# Patient Record
Sex: Female | Born: 1985 | Hispanic: Yes | Marital: Married | Smoking: Never smoker
Health system: Southern US, Community
[De-identification: ages and names within clinical notes are randomized; demographics above are authoritative.]

## PROBLEM LIST (undated history)

## (undated) DIAGNOSIS — F32A Depression, unspecified: Secondary | ICD-10-CM

## (undated) DIAGNOSIS — O24419 Gestational diabetes mellitus in pregnancy, unspecified control: Secondary | ICD-10-CM

## (undated) HISTORY — PX: OTHER SURGICAL HISTORY: SHX169

---

## 2007-05-28 DIAGNOSIS — O321XX0 Maternal care for breech presentation, not applicable or unspecified: Secondary | ICD-10-CM

## 2015-08-30 HISTORY — PX: OTHER SURGICAL HISTORY: SHX169

## 2020-10-23 ENCOUNTER — Other Ambulatory Visit: Payer: Self-pay

## 2020-10-23 ENCOUNTER — Encounter (HOSPITAL_COMMUNITY): Payer: Self-pay | Admitting: Emergency Medicine

## 2020-10-23 ENCOUNTER — Emergency Department (HOSPITAL_COMMUNITY)
Admission: EM | Admit: 2020-10-23 | Discharge: 2020-10-23 | Disposition: A | Discharge status: Home / Self Care | Payer: Self-pay | Attending: Emergency Medicine | Admitting: Emergency Medicine

## 2020-10-23 DIAGNOSIS — N898 Other specified noninflammatory disorders of vagina: Secondary | ICD-10-CM | POA: Insufficient documentation

## 2020-10-23 DIAGNOSIS — Z3A01 Less than 8 weeks gestation of pregnancy: Secondary | ICD-10-CM | POA: Insufficient documentation

## 2020-10-23 DIAGNOSIS — O209 Hemorrhage in early pregnancy, unspecified: Secondary | ICD-10-CM | POA: Insufficient documentation

## 2020-10-23 LAB — WET PREP, GENITAL
Clue Cells Wet Prep HPF POC: NONE SEEN
Sperm: NONE SEEN
Trich, Wet Prep: NONE SEEN
WBC, Wet Prep HPF POC: NONE SEEN
Yeast Wet Prep HPF POC: NONE SEEN

## 2020-10-23 LAB — HCG, QUANTITATIVE, PREGNANCY: hCG, Beta Chain, Quant, S: 11 m[IU]/mL — ABNORMAL HIGH (ref <5)

## 2020-10-23 LAB — GC/CHLAMYDIA PROBE AMP (~~LOC~~) NOT AT ARMC
Chlamydia: NEGATIVE
Comment: NEGATIVE
Comment: NORMAL
Neisseria Gonorrhea: NEGATIVE

## 2020-10-23 NOTE — ED Triage Notes (Signed)
Patient states she took two home test on Monday and they was positive. Patient last menstraul was January 22,2022. Patient states that she is having light pink tinge blood. Patient states she just started bleeding.

## 2020-10-23 NOTE — Discharge Instructions (Signed)
Follow up with Obgyn

## 2020-10-23 NOTE — ED Provider Notes (Signed)
[facility]-EMERGENCY DEPT Provider Note   CSN: [id number] Arrival date & time: 10/23/20  0126     History Chief Complaint  Patient presents with  . Possible Pregnancy    Colleen Patterson is a 35 y.o. female G2 P2 presents for evaluation of possible pregnancy.  LMP 10/20/2020.  States she took pregnancy test on Monday which was positive.  Patient states she would urinate approximately 20 minutes PTA and noted she had light pink tinge of blood when she wiped.  She denies any clots.  Patient does states she has had a white vaginal discharge.  She denies any concerns for STDs.  She has no associated pain.  No lightheadedness or dizziness.  She is unsure blood type however does not think she got RhoGam with her prior pregnancies.  No fever, chills, nausea, vomiting abdominal pain, pelvic pain, dysuria.  Denies additional aggravating or alleviating factors.  History pain from patient and past medical records. No interpreter used.  HPI     History reviewed. No pertinent past medical history.  There are no problems to display for this patient.   History reviewed. No pertinent surgical history.   OB History    Gravida  1   Para      Term      Preterm      AB      Living        SAB      IAB      Ectopic      Multiple      Live Births              History reviewed. No pertinent family history.  Social History   Tobacco Use  . Smoking status: Never Smoker  . Smokeless tobacco: Never Used  Vaping Use  . Vaping Use: Never used  Substance Use Topics  . Alcohol use: Never  . Drug use: Never    Home Medications Prior to Admission medications   Not on File    Allergies    Patient has no known allergies.  Review of Systems   Review of Systems  Constitutional: Negative.   HENT: Negative.   Respiratory: Negative.   Cardiovascular: Negative.   Gastrointestinal: Negative.   Genitourinary: Positive for menstrual problem and vaginal  bleeding.  Musculoskeletal: Negative.   Skin: Negative.   Neurological: Negative.   All other systems reviewed and are negative.   Physical Exam Updated Vital Signs BP 120/70 (BP Location: Left Arm)   Pulse 69   Temp 98.2 F (36.8 C) (Oral)   Resp 16   Ht 5\' 5"  (1.651 m)   Wt 77.1 kg   LMP 09/19/2020   SpO2 100%   BMI 28.29 kg/m   Physical Exam Vitals and nursing note reviewed. Exam conducted with a chaperone present.  Constitutional:      General: She is not in acute distress.    Appearance: She is well-developed and well-nourished. She is not ill-appearing or toxic-appearing.  HENT:     Head: Normocephalic and atraumatic.     Nose: Nose normal.     Mouth/Throat:     Mouth: Mucous membranes are moist.  Eyes:     Pupils: Pupils are equal, round, and reactive to light.  Cardiovascular:     Rate and Rhythm: Normal rate.     Pulses: Normal pulses and intact distal pulses.     Heart sounds: Normal heart sounds.  Pulmonary:     Effort: Pulmonary effort is  normal. No respiratory distress.     Breath sounds: Normal breath sounds.  Abdominal:     General: Bowel sounds are normal. There is no distension.     Tenderness: There is no abdominal tenderness. There is no right CVA tenderness, left CVA tenderness or guarding.  Genitourinary:    Comments: No adnexal tenderness or masses.  Mild dark red blood at cervical os.  Exam with female tech in room Musculoskeletal:        General: Normal range of motion.     Cervical back: Normal range of motion.  Skin:    General: Skin is warm and dry.     Capillary Refill: Capillary refill takes less than 2 seconds.  Neurological:     General: No focal deficit present.     Mental Status: She is alert.  Psychiatric:        Mood and Affect: Mood and affect normal.    ED Results / Procedures / Treatments   Labs (all labs ordered are listed, but only abnormal results are displayed) Labs Reviewed  HCG, QUANTITATIVE, PREGNANCY -  Abnormal; Notable for the following components:      Result Value   hCG, Beta Chain, Quant, S 11 (*)    All other components within normal limits  WET PREP, GENITAL  GC/CHLAMYDIA PROBE AMP ([place]) NOT AT [facility]    EKG None  Radiology No results found.  Procedures Procedures   Medications Ordered in ED Medications - No data to display  ED Course  I have reviewed the triage vital signs and the nursing notes.  Pertinent labs & imaging results that were available during my care of the patient were reviewed by me and considered in my medical decision making (see chart for details).  35 year old presents for evaluation of positive pregnancy test at home with some vaginal bleeding which began just PTA.  Has not had to wear a pad.  She is approximately 3 days late from her menstrual cycle.  Sexually active with her husband.  No associated abdominal pain.  LMP 09/19/2020.  Abdomen soft, nontender.  Quant hCG 11.  Pelvic exam does show some mild start bleeding at os.  No tenderness.  Given positive pregnancy test, bleeding, recommended assess labs as no Rh studies in computer.  Patient would like to hold off on this.  Discussed risk.  Voiced understanding.  Do not feel she needs ultrasound at this time.  Discussed close outpatient follow-up.  She is agreeable for this.  Given resources as she just moved here from [place].  The patient has been appropriately medically screened and/or stabilized in the ED. I have low suspicion for any other emergent medical condition which would require further screening, evaluation or treatment in the ED or require inpatient management.  Patient is hemodynamically stable and in no acute distress.  Patient able to ambulate in department prior to ED.  Evaluation does not show acute pathology that would require ongoing or additional emergent interventions while in the emergency department or further inpatient treatment.  I have discussed the diagnosis with the  patient and answered all questions.  Pain is been managed while in the emergency department and patient has no further complaints prior to discharge.  Patient is comfortable with plan discussed in room and is stable for discharge at this time.  I have discussed strict return precautions for returning to the emergency department.  Patient was encouraged to follow-up with PCP/specialist refer to at discharge.  MDM Rules/Calculators/A&P                           Final Clinical Impression(s) / ED Diagnoses Final diagnoses:  Bleeding in early pregnancy    Rx / DC Orders ED Discharge Orders    None       Cebert Dettmann A, PA-C 10/23/20 0506    Gilda Crease, MD 10/23/20 [phone]

## 2020-10-23 NOTE — ED Notes (Signed)
Pelvic setup at bedside.

## 2020-10-26 ENCOUNTER — Encounter: Payer: Self-pay | Admitting: Obstetrics and Gynecology

## 2020-10-26 ENCOUNTER — Ambulatory Visit (INDEPENDENT_AMBULATORY_CARE_PROVIDER_SITE_OTHER): Payer: Self-pay | Admitting: *Deleted

## 2020-10-26 ENCOUNTER — Other Ambulatory Visit: Payer: Self-pay

## 2020-10-26 ENCOUNTER — Other Ambulatory Visit: Payer: Self-pay | Admitting: Lactation Services

## 2020-10-26 VITALS — BP 109/79 | HR 69 | Ht 65.0 in | Wt 174.0 lb

## 2020-10-26 DIAGNOSIS — O209 Hemorrhage in early pregnancy, unspecified: Secondary | ICD-10-CM

## 2020-10-26 LAB — ABO AND RH: Rh Factor: POSITIVE

## 2020-10-26 LAB — BETA HCG QUANT (REF LAB): hCG Quant: 1 m[IU]/mL

## 2020-10-26 NOTE — Progress Notes (Signed)
Received call from RN call center stating [facility] was calling to report stat lab of ABO/Rh of A positive.   Baldemar Lenis, MD, [facility] Attending Center for [facility])

## 2020-10-26 NOTE — Progress Notes (Signed)
Pt seen @ [facility] on 2/25 due to light vaginal bleeding. Since then she has had some heavier bleeding and she had one clot observed. She reports no pain. BHCG drawn and pt was informed that she will be called with results later today. Pt does not know her blood type and therefore also had ABO/Rh test done. She voiced understanding and stated that a detailed message can be left on her voicemail if she does not answer.   1435  Reviewed BHCG results (1) with Dr. Debroah Loop who finds decrease in level which is consistent with definitive miscarriage. Blood type is A+, therefore RhoGam is not indicated.   Pt was informed of results and that no further testing is needed. Pt was offered follow up appt in office for birth control or other concerns. She declined due to finances and lack of insurance. Pt stated that she and husband have been trying for almost 2 years to get pregnant. I advised recommendation to resume efforts for pregnancy until after her next normal period. She may use condoms until that time. Pt may schedule appt in the future if desired for Gyn care. She voiced understanding.

## 2021-05-02 ENCOUNTER — Encounter (HOSPITAL_COMMUNITY): Payer: Self-pay | Admitting: Emergency Medicine

## 2021-05-02 ENCOUNTER — Emergency Department (HOSPITAL_COMMUNITY)
Admission: EM | Admit: 2021-05-02 | Discharge: 2021-05-02 | Disposition: A | Discharge status: Home / Self Care | Payer: Medicaid Other | Attending: Emergency Medicine | Admitting: Emergency Medicine

## 2021-05-02 ENCOUNTER — Other Ambulatory Visit: Payer: Self-pay

## 2021-05-02 ENCOUNTER — Emergency Department (HOSPITAL_COMMUNITY): Payer: Medicaid Other

## 2021-05-02 DIAGNOSIS — N938 Other specified abnormal uterine and vaginal bleeding: Secondary | ICD-10-CM | POA: Diagnosis not present

## 2021-05-02 DIAGNOSIS — Z3A01 Less than 8 weeks gestation of pregnancy: Secondary | ICD-10-CM | POA: Insufficient documentation

## 2021-05-02 DIAGNOSIS — O4690 Antepartum hemorrhage, unspecified, unspecified trimester: Secondary | ICD-10-CM

## 2021-05-02 DIAGNOSIS — O26891 Other specified pregnancy related conditions, first trimester: Secondary | ICD-10-CM | POA: Insufficient documentation

## 2021-05-02 DIAGNOSIS — D72829 Elevated white blood cell count, unspecified: Secondary | ICD-10-CM | POA: Diagnosis not present

## 2021-05-02 DIAGNOSIS — R102 Pelvic and perineal pain: Secondary | ICD-10-CM | POA: Diagnosis not present

## 2021-05-02 LAB — COMPREHENSIVE METABOLIC PANEL
ALT: 31 U/L (ref 0–44)
AST: 14 U/L — ABNORMAL LOW (ref 15–41)
Albumin: 4.5 g/dL (ref 3.5–5.0)
Alkaline Phosphatase: 65 U/L (ref 38–126)
Anion gap: 9 (ref 5–15)
BUN: 8 mg/dL (ref 6–20)
CO2: 26 mmol/L (ref 22–32)
Calcium: 9.9 mg/dL (ref 8.9–10.3)
Chloride: 105 mmol/L (ref 98–111)
Creatinine, Ser: 0.65 mg/dL (ref 0.44–1.00)
GFR, Estimated: >60 mL/min (ref >60)
Glucose, Bld: 100 mg/dL — ABNORMAL HIGH (ref 70–99)
Potassium: 3.8 mmol/L (ref 3.5–5.1)
Sodium: 140 mmol/L (ref 135–145)
Total Bilirubin: 0.5 mg/dL (ref 0.3–1.2)
Total Protein: 8.2 g/dL — ABNORMAL HIGH (ref 6.5–8.1)

## 2021-05-02 LAB — CBC WITH DIFFERENTIAL/PLATELET
Abs Immature Granulocytes: 0.03 10*3/uL (ref 0.00–0.07)
Basophils Absolute: 0.1 10*3/uL (ref 0.0–0.1)
Basophils Relative: 0 %
Eosinophils Absolute: 0.2 10*3/uL (ref 0.0–0.5)
Eosinophils Relative: 1 %
HCT: 47 % — ABNORMAL HIGH (ref 36.0–46.0)
Hemoglobin: 15.3 g/dL — ABNORMAL HIGH (ref 12.0–15.0)
Immature Granulocytes: 0 %
Lymphocytes Relative: 21 %
Lymphs Abs: 2.5 10*3/uL (ref 0.7–4.0)
MCH: 29.5 pg (ref 26.0–34.0)
MCHC: 32.6 g/dL (ref 30.0–36.0)
MCV: 90.7 fL (ref 80.0–100.0)
Monocytes Absolute: 0.5 10*3/uL (ref 0.1–1.0)
Monocytes Relative: 4 %
Neutro Abs: 8.6 10*3/uL — ABNORMAL HIGH (ref 1.7–7.7)
Neutrophils Relative %: 74 %
Platelets: 299 10*3/uL (ref 150–400)
RBC: 5.18 MIL/uL — ABNORMAL HIGH (ref 3.87–5.11)
RDW: 13.1 % (ref 11.5–15.5)
WBC: 11.9 10*3/uL — ABNORMAL HIGH (ref 4.0–10.5)
nRBC: 0 % (ref 0.0–0.2)

## 2021-05-02 LAB — URINALYSIS, ROUTINE W REFLEX MICROSCOPIC
Bacteria, UA: NONE SEEN
Bilirubin Urine: NEGATIVE
Glucose, UA: NEGATIVE mg/dL
Hgb urine dipstick: NEGATIVE
Ketones, ur: NEGATIVE mg/dL
Leukocytes,Ua: NEGATIVE
Nitrite: NEGATIVE
Protein, ur: NEGATIVE mg/dL
Specific Gravity, Urine: <1.005 — ABNORMAL LOW (ref 1.005–1.030)
pH: 6.5 (ref 5.0–8.0)

## 2021-05-02 LAB — WET PREP, GENITAL
Sperm: NONE SEEN
Trich, Wet Prep: NONE SEEN
Yeast Wet Prep HPF POC: NONE SEEN

## 2021-05-02 LAB — SAMPLE TO BLOOD BANK

## 2021-05-02 LAB — HCG, QUANTITATIVE, PREGNANCY: hCG, Beta Chain, Quant, S: 190 m[IU]/mL — ABNORMAL HIGH (ref <5)

## 2021-05-02 MED ORDER — METRONIDAZOLE 500 MG PO TABS: 500.0000 mg | ORAL_TABLET | Freq: Two times a day (BID) | ORAL | 0 refills | Status: DC

## 2021-05-02 NOTE — ED Provider Notes (Signed)
[facility]-EMER[facility] DEPT Provider Note   CSN: [id number] Arrival date & time: 05/02/21  1316     History Chief Complaint  Patient presents with   Vaginal Bleeding    Colleen Patterson is a 35 y.o. female.  Colleen Patterson is a 35 y.o. female who reports that she is about [redacted] weeks pregnant, G4P2A1,  otherwise healthy, presents for evaluation of vaginal bleeding.  She reports last night when she went to the bathroom she noticed a small amount of blood in pink liquid when she wiped.  She reports later when she got up in the middle of the night this was resolved but she noticed it again this morning when she woke up she has not had any larger amounts of vaginal bleeding or passed any clots today.  She reports that she is having some intermittent lower abdominal cramping and discomfort since she discovered she was pregnant but this is not worse than usual today.  Reports that she went to a early pregnancy clinic to get her confirmed positive pregnancy and ultrasound on 9/2 and she was told that this showed an intrauterine pregnancy measuring in about 5 weeks.  She thinks that her last menstrual cycle was at the end of July.  Reports history of 1 prior miscarriage in March but has had 2 healthy uncomplicated pregnancies previously.  No lightheadedness or syncope.  No other aggravating or alleviating factors.  Does report that she has had some increased vaginal discharge and urinary frequency this week.  The history is provided by the patient.      History reviewed. No pertinent past medical history.  There are no problems to display for this patient.   History reviewed. No pertinent surgical history.   OB History     Gravida  4   Para  2   Term      Preterm      AB  1   Living         SAB  1   IAB      Ectopic      Multiple      Live Births              No family history on file.  Social History   Tobacco Use   Smoking status: Never    Smokeless tobacco: Never  Vaping Use   Vaping Use: Never used  Substance Use Topics   Alcohol use: Never   Drug use: Never    Home Medications Prior to Admission medications   Medication Sig Start Date End Date Taking? Authorizing Provider  metroNIDAZOLE (FLAGYL) 500 MG tablet Take 1 tablet (500 mg total) by mouth 2 (two) times daily. One po bid x 7 days 05/02/21  Yes Dartha Lodge, PA-C    Allergies    Patient has no known allergies.  Review of Systems   Review of Systems  Constitutional:  Negative for chills and fever.  Respiratory:  Negative for shortness of breath.   Cardiovascular:  Negative for chest pain.  Gastrointestinal:  Negative for abdominal pain, nausea and vomiting.  Genitourinary:  Positive for pelvic pain, vaginal bleeding and vaginal discharge. Negative for dysuria.  Musculoskeletal:  Negative for arthralgias and myalgias.  Skin:  Negative for color change and rash.  Neurological:  Negative for dizziness, syncope and light-headedness.  All other systems reviewed and are negative.  Physical Exam Updated Vital Signs BP (!) 123/98 (BP Location: Left Arm)   Pulse (!) 107  Temp 98.1 F (36.7 C) (Oral)   Resp 18   LMP 09/19/2020   SpO2 100%   Physical Exam Vitals and nursing note reviewed. Exam conducted with a chaperone present.  Constitutional:      General: She is not in acute distress.    Appearance: Normal appearance. She is well-developed and normal weight. She is not ill-appearing or diaphoretic.  HENT:     Head: Normocephalic and atraumatic.  Eyes:     General:        Right eye: No discharge.        Left eye: No discharge.  Cardiovascular:     Rate and Rhythm: Normal rate and regular rhythm.     Pulses: Normal pulses.     Heart sounds: Normal heart sounds.  Pulmonary:     Effort: Pulmonary effort is normal. No respiratory distress.     Breath sounds: Normal breath sounds. No wheezing or rales.     Comments: Respirations equal and unlabored,  patient able to speak in full sentences, lungs clear to auscultation bilaterally  Abdominal:     General: Bowel sounds are normal. There is no distension.     Palpations: Abdomen is soft. There is no mass.     Tenderness: There is no abdominal tenderness. There is no guarding.     Comments: Abdomen soft, nondistended, nontender to palpation in all quadrants without guarding or peritoneal signs  Musculoskeletal:        General: No deformity.     Cervical back: Neck supple.     Comments: Chaperone present during pelvic exam. No external genital lesions noted. On speculum exam patient has a very small amount of blood present in the vaginal vault and a small amount of discharge present, cervix is closed, no products of conception noted No cervical motion tenderness or adnexal tenderness.  Skin:    General: Skin is warm and dry.     Capillary Refill: Capillary refill takes less than 2 seconds.  Neurological:     Mental Status: She is alert and oriented to person, place, and time.     Coordination: Coordination normal.     Comments: Speech is clear, able to follow commands Moves extremities without ataxia, coordination intact  Psychiatric:        Mood and Affect: Mood normal.        Behavior: Behavior normal.    ED Results / Procedures / Treatments   Labs (all labs ordered are listed, but only abnormal results are displayed) Labs Reviewed  WET PREP, GENITAL - Abnormal; Notable for the following components:      Result Value   Clue Cells Wet Prep HPF POC PRESENT (*)    WBC, Wet Prep HPF POC MODERATE (*)    All other components within normal limits  COMPREHENSIVE METABOLIC PANEL - Abnormal; Notable for the following components:   Glucose, Bld 100 (*)    Total Protein 8.2 (*)    AST 14 (*)    All other components within normal limits  CBC WITH DIFFERENTIAL/PLATELET - Abnormal; Notable for the following components:   WBC 11.9 (*)    RBC 5.18 (*)    Hemoglobin 15.3 (*)    HCT 47.0  (*)    Neutro Abs 8.6 (*)    All other components within normal limits  URINALYSIS, ROUTINE W REFLEX MICROSCOPIC - Abnormal; Notable for the following components:   Color, Urine YELLOW (*)    APPearance CLEAR (*)    Specific Gravity, Urine <  1.005 (*)    All other components within normal limits  HCG, QUANTITATIVE, PREGNANCY - Abnormal; Notable for the following components:   hCG, Beta Chain, Quant, S 190 (*)    All other components within normal limits  SAMPLE TO BLOOD BANK  GC/CHLAMYDIA PROBE AMP ([place]) NOT AT [facility]    EKG None  Radiology US OB Comp < 14 Wks  Result Date: 05/02/2021 CLINICAL DATA:  Vaginal bleeding, positive pregnancy test EXAM: OBSTETRIC <14 WK ULTRASOUND; TRANSVAGINAL OB ULTRASOUND TECHNIQUE: Transvaginal ultrasound was performed for complete evaluation of the gestation as well as the maternal uterus, adnexal regions, and pelvic cul-de-sac. COMPARISON:  None. FINDINGS: Intrauterine gestational sac: There is a small amount of fluid versus tiny gestational sac measuring 5 mm in the uterus (image 66/96). Yolk sac:  Not Visualized. Embryo:  Not Visualized. Cardiac Activity: Not Visualized. Subchorionic hemorrhage:  None visualized. Maternal uterus/adnexae: Normal appearance of the bilateral ovaries. No free fluid in the pelvis. IMPRESSION: Tiny amount of fluid versus very early gestational sac in the uterus. No embryo or cardiac activity identified. Serial beta HCG is suggested. Consider repeat pelvic ultrasound as clinically indicated. Electronically Signed   By: Emmaline KluverNancy  Ballantyne M.D.   On: 05/02/2021 16:38   US OB Transvaginal  Result Date: 05/02/2021 CLINICAL DATA:  Vaginal bleeding, positive pregnancy test EXAM: OBSTETRIC <14 WK ULTRASOUND; TRANSVAGINAL OB ULTRASOUND TECHNIQUE: Transvaginal ultrasound was performed for complete evaluation of the gestation as well as the maternal uterus, adnexal regions, and pelvic cul-de-sac. COMPARISON:  None. FINDINGS: Intrauterine  gestational sac: There is a small amount of fluid versus tiny gestational sac measuring 5 mm in the uterus (image 66/96). Yolk sac:  Not Visualized. Embryo:  Not Visualized. Cardiac Activity: Not Visualized. Subchorionic hemorrhage:  None visualized. Maternal uterus/adnexae: Normal appearance of the bilateral ovaries. No free fluid in the pelvis. IMPRESSION: Tiny amount of fluid versus very early gestational sac in the uterus. No embryo or cardiac activity identified. Serial beta HCG is suggested. Consider repeat pelvic ultrasound as clinically indicated. Electronically Signed   By: Emmaline KluverNancy  Ballantyne M.D.   On: 05/02/2021 16:38    Procedures Procedures   Medications Ordered in ED Medications - No data to display  ED Course  I have reviewed the triage vital signs and the nursing notes.  Pertinent labs & imaging results that were available during my care of the patient were reviewed by me and considered in my medical decision making (see chart for details).    MDM Rules/Calculators/A&P                           Patient presents with vaginal bleeding starting last night, estimates that she is about [redacted] weeks pregnant, had outpatient ultrasound done that confirmed an IUP on Friday.  Is only seeing blood when she wipes, no clots present.  No associated abdominal pain or cramping.  Given confirmed IUP, no concern for ectopic pregnancy but given prior history of miscarriage will check labs and ultrasound.  Pelvic exam with small amount of bleeding but cervical os is closed, small amount of discharge noted as well.  I have independently ordered, reviewed and interpreted all labs and imaging: CBC: Mild leukocytosis, hemoglobin slightly elevated at 15.3 as well CMP: No significant electrolyte derangements and normal renal and liver function. hCG: 190, much lower than I would expect for a 5-week pregnancy, high clinical suspicion for miscarriage UA: No bacteria or signs of infection Wet prep: Consistent  with BV  Ultrasound with tiny amount of fluid versus very early gestational sac present in the uterus, no embryo or cardiac activity identified, given patient had recent outpatient ultrasound that reportedly showed a gestational age of [redacted] weeks.  Concern for miscarriage and I discussed this with patient.  She will need to follow-up in 48 hours for repeat hCG.  Case discussed with CNM Walidah with [facility], she was able to schedule patient for follow-up appointment in the office on Tuesday morning for repeat hCG testing.  I discussed this with patient, discussed appropriate return precautions and follow-up, patient prescribed Flagyl for BV and discharged home.  Final Clinical Impression(s) / ED Diagnoses Final diagnoses:  Vaginal bleeding in pregnancy    Rx / DC Orders ED Discharge Orders          Ordered    metroNIDAZOLE (FLAGYL) 500 MG tablet  2 times daily        05/02/21 1800             Dartha Lodge, [place] 05/02/21 2343    Tegeler, Canary Brim, MD 05/03/21 [phone]

## 2021-05-02 NOTE — Discharge Instructions (Addendum)
You have an appointment scheduled at 9 AM on Tuesday [facility], please arrive at least 15 minutes early  If you have worsening vaginal bleeding, pain or feel like you are going to pass out before this appointment on Tuesday please go to the MAU at the women [facility] at [facility].  Your wet prep showed signs of bacterial vaginosis which may be causing the discharge you have been having intermittently, take Flagyl twice daily for the next week, do not consume alcohol with this medication.

## 2021-05-02 NOTE — ED Provider Notes (Signed)
Emergency Medicine Provider Triage Evaluation Note  Colleen Patterson , a 35 y.o. female  was evaluated in triage.  Pt complains of vaginal bleeding. She had an ultrasound on 04/30/21 showing an intrauterine gestation per patients report.  She states that last night she wiped and saw pink.  She had a similar episode this morning.  She does endorse increased vaginal discharge over a week and increased urinary frequency.    Chart review shows previous blood typing of A positive.   Review of Systems  Positive: Vaginal discharge, vaginal bleeding Negative: Pelvic pain and cramping  Physical Exam  BP (!) 123/98 (BP Location: Left Arm)   Pulse (!) 107   Temp 98.1 F (36.7 C) (Oral)   Resp 18   LMP 09/19/2020   SpO2 100%  Gen:   Awake, no distress   Resp:  Normal effort  MSK:   Moves extremities without difficulty  Other:  Patient is awake and alert.   Medical Decision Making  Medically screening exam initiated at 1:53 PM.  Appropriate orders placed.  Colleen Patterson was informed that the remainder of the evaluation will be completed by another provider, this initial triage assessment does not replace that evaluation, and the importance of remaining in the ED until their evaluation is complete.     Colleen Gong, PA-C 05/02/21 1401    Colleen Dibbles, MD 05/03/21 [phone]

## 2021-05-02 NOTE — ED Triage Notes (Signed)
Patient c/o one episode blood on toilet paper after urination last night and one episode this morning. Denies pain. Seen at [facility] on 9/2. [redacted]w[redacted]d. Due date 12/30/21

## 2021-05-04 ENCOUNTER — Other Ambulatory Visit (INDEPENDENT_AMBULATORY_CARE_PROVIDER_SITE_OTHER): Payer: Self-pay | Admitting: General Practice

## 2021-05-04 ENCOUNTER — Other Ambulatory Visit: Payer: Self-pay

## 2021-05-04 ENCOUNTER — Encounter: Payer: Self-pay | Admitting: General Practice

## 2021-05-04 VITALS — BP 111/75 | HR 80 | Ht 65.0 in | Wt 175.0 lb

## 2021-05-04 DIAGNOSIS — O3680X0 Pregnancy with inconclusive fetal viability, not applicable or unspecified: Secondary | ICD-10-CM

## 2021-05-04 LAB — GC/CHLAMYDIA PROBE AMP (~~LOC~~) NOT AT ARMC
Chlamydia: NEGATIVE
Comment: NEGATIVE
Comment: NORMAL
Neisseria Gonorrhea: NEGATIVE

## 2021-05-04 LAB — BETA HCG QUANT (REF LAB): hCG Quant: 63 m[IU]/mL

## 2021-05-04 NOTE — Progress Notes (Signed)
Chart reviewed for nurse visit. Agree with plan of care.   PUL, trend hcg to normal.   Venora Maples, MD 05/04/21 4:17 PM

## 2021-05-04 NOTE — Progress Notes (Signed)
Patient presents to office today following up from ER visit on 9/4. Patient reports continued occasional pink spotting- denies pain. She is concerned about a miscarriage given recent history of loss in February. Discussed with patient we are monitoring your bhcg levels today, results take approximately 2 hours to finalize and will be reviewed with a provider in the office. Advised we will then contact you with results/updated plan of care. Patient verbalized understanding.  Reviewed results with Dr Crissie Reese who finds decreasing bhcg levels indicative of SAB, patient should have follow up bhcg in 1 week to trend down.  Called patient and informed her of results and discussed recommended follow up. Patient would like a follow up appt with a provider given that this is her second loss this year. Both appts scheduled. Patient will follow up for bhcg next week.  Chase Caller RN BSN 05/04/21

## 2021-05-14 ENCOUNTER — Other Ambulatory Visit: Payer: Self-pay

## 2021-05-19 ENCOUNTER — Other Ambulatory Visit: Payer: Self-pay

## 2021-05-19 DIAGNOSIS — O3680X0 Pregnancy with inconclusive fetal viability, not applicable or unspecified: Secondary | ICD-10-CM

## 2021-05-20 LAB — BETA HCG QUANT (REF LAB): hCG Quant: <1 m[IU]/mL

## 2021-05-21 ENCOUNTER — Encounter: Payer: Self-pay | Admitting: Certified Nurse Midwife

## 2021-05-21 ENCOUNTER — Ambulatory Visit (INDEPENDENT_AMBULATORY_CARE_PROVIDER_SITE_OTHER): Payer: Medicaid Other | Admitting: Certified Nurse Midwife

## 2021-05-21 ENCOUNTER — Other Ambulatory Visit: Payer: Self-pay

## 2021-05-21 VITALS — BP 105/70 | HR 63 | Ht 65.0 in | Wt 176.0 lb

## 2021-05-21 DIAGNOSIS — B9689 Other specified bacterial agents as the cause of diseases classified elsewhere: Secondary | ICD-10-CM

## 2021-05-21 DIAGNOSIS — O039 Complete or unspecified spontaneous abortion without complication: Secondary | ICD-10-CM | POA: Diagnosis not present

## 2021-05-21 DIAGNOSIS — N760 Acute vaginitis: Secondary | ICD-10-CM

## 2021-05-21 MED ORDER — BORIC ACID CRYS: 600.0000 mg | CRYSTALS | Freq: Every day | 5 refills | Status: DC

## 2021-05-21 NOTE — Progress Notes (Signed)
   Subjective:   Patient Name: Colleen Patterson, female   DOB: 1986-05-12, 35 y.o.  MRN: [id number]  HPI Seen for follow up of SAB 2 weeks ago. Denies bleeding, cramping. Menses has not returned yet. Does not plan on becoming pregnant soon. Is concerned because she had two normal pregnancies years ago (with a different partner) and has now had two very early miscarriages. Last MC was on.ly a couple of days after her period was due, this was about a week after. Both times her HCG failed to rise and she never had more than a gestational sac present. Used to have a period q33mo but since removal of her IUD two years ago, she's had very regular cycles. FOB has never had children or a pregnancy scare with other sexual partners.  Upon review of diet and lifestyle - pt usually eats twice daily, "not very healthy", does not exercise and most if not all of her energy is focused on her children and work. Stated she had bloodwork and testing done in [place] but they never found anything wrong that would affect her fertility.  Reports recurrent BV (present on last two wet preps), wants to discuss preventative therapies.  Review of Systems Pertinent items noted in HPI and remainder of comprehensive ROS otherwise negative.    Objective:   Physical Exam  BP 105/70   Pulse 63   Ht 5\' 5"  (1.651 m)   Wt 176 lb (79.8 kg)   LMP 09/19/2020   Breastfeeding Unknown   BMI 29.29 kg/m  Constitutional: She is oriented to person, place, and time. She appears well-developed and well-nourished.  Cardiovascular: Normal rate.  Abdominal: Soft. She exhibits no distension.  Neurological: She is alert and oriented to person, place, and time.  Skin: Skin is warm and dry.  Psychiatric: She has a normal mood and affect. Her behavior is normal. Judgment and thought content normal.     Assessment & Plan:  1. SAB (spontaneous abortion) - Patient counseled regarding future pregnancy plans and timing. Does not desire contraception. -  Given her previous fertility and regular cycles, as well as pt report of previous test results, additional fertility testing is not indicated at this time. Reviewed lifestyle and dietary changes to increase fertility success including eating a whole foods based diet q3-4hrs throughout the day and good hydration. Also discussed stress modulation techniques. Advised pt to have FOB see a urologist for semen assessment and gave referral info for [facility].  2. Recurrent BV - Gave prescription for pt to use boric acid vaginal suppositories for preventative therapy.  PRN follow up or for annual exam.  09/21/2020 MSN, CNM, [facility] 05/21/21  3:08 PM

## 2021-05-21 NOTE — Patient Instructions (Addendum)
Have your husband contact  [facility]:  [phone] [address] [place], [place] [address]  Try inserting 600mg  boric acid vaginally at night every month after your cycle.

## 2021-06-21 ENCOUNTER — Ambulatory Visit (INDEPENDENT_AMBULATORY_CARE_PROVIDER_SITE_OTHER): Payer: Medicaid Other | Admitting: Obstetrics and Gynecology

## 2021-06-21 ENCOUNTER — Other Ambulatory Visit (HOSPITAL_COMMUNITY)
Admission: RE | Admit: 2021-06-21 | Discharge: 2021-06-21 | Disposition: A | Discharge status: Home / Self Care | Payer: Medicaid Other | Source: Ambulatory Visit | Attending: Obstetrics and Gynecology | Admitting: Obstetrics and Gynecology

## 2021-06-21 ENCOUNTER — Other Ambulatory Visit: Payer: Self-pay

## 2021-06-21 ENCOUNTER — Encounter: Payer: Self-pay | Admitting: Obstetrics and Gynecology

## 2021-06-21 DIAGNOSIS — Z202 Contact with and (suspected) exposure to infections with a predominantly sexual mode of transmission: Secondary | ICD-10-CM | POA: Insufficient documentation

## 2021-06-21 DIAGNOSIS — Z01419 Encounter for gynecological examination (general) (routine) without abnormal findings: Secondary | ICD-10-CM | POA: Insufficient documentation

## 2021-06-21 DIAGNOSIS — R109 Unspecified abdominal pain: Secondary | ICD-10-CM | POA: Insufficient documentation

## 2021-06-21 DIAGNOSIS — R1084 Generalized abdominal pain: Secondary | ICD-10-CM | POA: Diagnosis not present

## 2021-06-21 NOTE — Patient Instructions (Signed)
Health Maintenance, Female Adopting a healthy lifestyle and getting preventive care are important in promoting health and wellness. Ask your health care provider about: The right schedule for you to have regular tests and exams. Things you can do on your own to prevent diseases and keep yourself healthy. What should I know about diet, weight, and exercise? Eat a healthy diet  Eat a diet that includes plenty of vegetables, fruits, low-fat dairy products, and lean protein. Do not eat a lot of foods that are high in solid fats, added sugars, or sodium. Maintain a healthy weight Body mass index (BMI) is used to identify weight problems. It estimates body fat based on height and weight. Your health care provider can help determine your BMI and help you achieve or maintain a healthy weight. Get regular exercise Get regular exercise. This is one of the most important things you can do for your health. Most adults should: Exercise for at least 150 minutes each week. The exercise should increase your heart rate and make you sweat (moderate-intensity exercise). Do strengthening exercises at least twice a week. This is in addition to the moderate-intensity exercise. Spend less time sitting. Even light physical activity can be beneficial. Watch cholesterol and blood lipids Have your blood tested for lipids and cholesterol at 33 years of age, then have this test every 5 years. Have your cholesterol levels checked more often if: Your lipid or cholesterol levels are high. You are older than 33 years of age. You are at high risk for heart disease. What should I know about cancer screening? Depending on your health history and family history, you may need to have cancer screening at various ages. This may include screening for: Breast cancer. Cervical cancer. Colorectal cancer. Skin cancer. Lung cancer. What should I know about heart disease, diabetes, and high blood pressure? Blood pressure and heart  disease High blood pressure causes heart disease and increases the risk of stroke. This is more likely to develop in people who have high blood pressure readings, are of African descent, or are overweight. Have your blood pressure checked: Every 3-5 years if you are 18-39 years of age. Every year if you are 40 years old or older. Diabetes Have regular diabetes screenings. This checks your fasting blood sugar level. Have the screening done: Once every three years after age 40 if you are at a normal weight and have a low risk for diabetes. More often and at a younger age if you are overweight or have a high risk for diabetes. What should I know about preventing infection? Hepatitis B If you have a higher risk for hepatitis B, you should be screened for this virus. Talk with your health care provider to find out if you are at risk for hepatitis B infection. Hepatitis C Testing is recommended for: Everyone born from 1945 through 1965. Anyone with known risk factors for hepatitis C. Sexually transmitted infections (STIs) Get screened for STIs, including gonorrhea and chlamydia, if: You are sexually active and are younger than 33 years of age. You are older than 33 years of age and your health care provider tells you that you are at risk for this type of infection. Your sexual activity has changed since you were last screened, and you are at increased risk for chlamydia or gonorrhea. Ask your health care provider if you are at risk. Ask your health care provider about whether you are at high risk for HIV. Your health care provider may recommend a prescription medicine   to help prevent HIV infection. If you choose to take medicine to prevent HIV, you should first get tested for HIV. You should then be tested every 3 months for as long as you are taking the medicine. Pregnancy If you are about to stop having your period (premenopausal) and you may become pregnant, seek counseling before you get  pregnant. Take 400 to 800 micrograms (mcg) of folic acid every day if you become pregnant. Ask for birth control (contraception) if you want to prevent pregnancy. Osteoporosis and menopause Osteoporosis is a disease in which the bones lose minerals and strength with aging. This can result in bone fractures. If you are 65 years old or older, or if you are at risk for osteoporosis and fractures, ask your health care provider if you should: Be screened for bone loss. Take a calcium or vitamin D supplement to lower your risk of fractures. Be given hormone replacement therapy (HRT) to treat symptoms of menopause. Follow these instructions at home: Lifestyle Do not use any products that contain nicotine or tobacco, such as cigarettes, e-cigarettes, and chewing tobacco. If you need help quitting, ask your health care provider. Do not use street drugs. Do not share needles. Ask your health care provider for help if you need support or information about quitting drugs. Alcohol use Do not drink alcohol if: Your health care provider tells you not to drink. You are pregnant, may be pregnant, or are planning to become pregnant. If you drink alcohol: Limit how much you use to 0-1 drink a day. Limit intake if you are breastfeeding. Be aware of how much alcohol is in your drink. In the [place] one drink equals one 12 oz bottle of beer (355 mL), one 5 oz glass of wine (148 mL), or one 1 oz glass of hard liquor (44 mL). General instructions Schedule regular health, dental, and eye exams. Stay current with your vaccines. Tell your health care provider if: You often feel depressed. You have ever been abused or do not feel safe at home. Summary Adopting a healthy lifestyle and getting preventive care are important in promoting health and wellness. Follow your health care provider's instructions about healthy diet, exercising, and getting tested or screened for diseases. Follow your health care provider's  instructions on monitoring your cholesterol and blood pressure. This information is not intended to replace advice given to you by your health care provider. Make sure you discuss any questions you have with your health care provider. Document Revised: 10/23/2020 Document Reviewed: 08/08/2018 Elsevier Patient Education  2022 Elsevier Inc.  

## 2021-06-21 NOTE — Progress Notes (Signed)
Patient reported lower abdominal pain for 1 month. Pap smear,last known pap 2019 STI testing today

## 2021-06-21 NOTE — Progress Notes (Signed)
Colleen Patterson is a 35 y.o. [phone] female here for a routine annual gynecologic exam.  Current complaints: generalized abd/pelvic pain for the last several months. Only notes with prolonged sitting. Some problems with constipation as well. No pain with intercourse. Cycles regular, not painful or long.   Denies abnormal vaginal bleeding, discharge, pelvic pain, problems with intercourse or other gynecologic concerns.    Gynecologic History Patient's last menstrual period was 06/04/2021. Contraception: none   Obstetric History OB History  Gravida Para Term Preterm AB Living  4 2 2  0 1 2  SAB IAB Ectopic Multiple Live Births  1 0 0 0 2    # Outcome Date GA Lbr Len/2nd Weight Sex Delivery Anes PTL Lv  4 Gravida           3 SAB           2 Term           1 Term             History reviewed. No pertinent past medical history.  History reviewed. No pertinent surgical history.  Current Outpatient Medications on File Prior to Visit  Medication Sig Dispense Refill   Boric Acid CRYS Place 600 mg vaginally at bedtime. Use vaginally for two nights after your period has stopped. 500 g 5   Prenatal Vit-Fe Fumarate-FA (MULTIVITAMIN-PRENATAL) 27-0.8 MG TABS tablet Take 1 tablet by mouth daily at 12 noon.     No current facility-administered medications on file prior to visit.    No Known Allergies  Social History   Socioeconomic History   Marital status: Married    Spouse name: Not on file   Number of children: Not on file   Years of education: Not on file   Highest education level: Not on file  Occupational History   Not on file  Tobacco Use   Smoking status: Never   Smokeless tobacco: Never  Vaping Use   Vaping Use: Never used  Substance and Sexual Activity   Alcohol use: Never   Drug use: Never   Sexual activity: Not on file  Other Topics Concern   Not on file  Social History Narrative   Not on file   Social Determinants of Health   Financial Resource Strain: Not on  file  Food Insecurity: No Food Insecurity   Worried About Running Out of Food in the Last Year: Never true   Ran Out of Food in the Last Year: Never true  Transportation Needs: No Transportation Needs   Lack of Transportation (Medical): No   Lack of Transportation (Non-Medical): No  Physical Activity: Not on file  Stress: Not on file  Social Connections: Not on file  Intimate Partner Violence: Not on file    History reviewed. No pertinent family history.  The following portions of the patient's history were reviewed and updated as appropriate: allergies, current medications, past family history, past medical history, past social history, past surgical history and problem list.  Review of Systems Pertinent items noted in HPI and remainder of comprehensive ROS otherwise negative.   Objective:  BP 112/75   Pulse 76   Ht 5\' 5"  (1.651 m)   Wt 181 lb 4.8 oz (82.2 kg)   LMP 06/04/2021   BMI 30.17 kg/m  Chaperone present during exam CONSTITUTIONAL: Well-developed, well-nourished female in no acute distress.  HENT:  Normocephalic, atraumatic, External right and left ear normal. Oropharynx is clear and moist EYES: Conjunctivae and EOM are normal. Pupils are equal,  round, and reactive to light. No scleral icterus.  NECK: Normal range of motion, supple, no masses.  Normal thyroid.  SKIN: Skin is warm and dry. No rash noted. Not diaphoretic. No erythema. No pallor. NEUROLGIC: Alert and oriented to person, place, and time. Normal reflexes, muscle tone coordination. No cranial nerve deficit noted. PSYCHIATRIC: Normal mood and affect. Normal behavior. Normal judgment and thought content. CARDIOVASCULAR: Normal heart rate noted, regular rhythm RESPIRATORY: Clear to auscultation bilaterally. Effort and breath sounds normal, no problems with respiration noted. BREASTS: Symmetric in size. No masses, skin changes, nipple drainage, or lymphadenopathy. ABDOMEN: Soft, normal bowel sounds, no  distention noted.  No tenderness, rebound or guarding.  PELVIC: Normal appearing external genitalia; normal appearing vaginal mucosa and cervix.  No abnormal discharge noted.  Pap smear obtained.  Normal uterine size, no other palpable masses, no uterine or adnexal tenderness. MUSCULOSKELETAL: Normal range of motion. No tenderness.  No cyanosis, clubbing, or edema.  2+ distal pulses.   Assessment:  Annual gynecologic examination with pap smear  STD exposure Abd/pelvic pain Plan:  Will follow up results of pap smear and manage accordingly. STD testing as per pt request. Abd/pelvic pain, doubt GYN related. Suspect GI related Decline Flu and Tdap vaccines Routine preventative health maintenance measures emphasized. Please refer to After Visit Summary for other counseling recommendations.    [name], MD, FACOG Attending Obstetrician & Gynecologist Center for [facility]

## 2021-06-21 NOTE — Progress Notes (Signed)
Patient presents as a New Patient transfer from [facility] for abdominal pain

## 2021-06-22 LAB — CERVICOVAGINAL ANCILLARY ONLY
Chlamydia: NEGATIVE
Comment: NEGATIVE
Comment: NEGATIVE
Comment: NORMAL
Neisseria Gonorrhea: NEGATIVE
Trichomonas: NEGATIVE

## 2021-06-22 LAB — RPR: RPR Ser Ql: NONREACTIVE

## 2021-06-22 LAB — HIV ANTIBODY (ROUTINE TESTING W REFLEX): HIV Screen 4th Generation wRfx: NONREACTIVE

## 2021-06-22 LAB — HEPATITIS C ANTIBODY: Hep C Virus Ab: <0.1 s/co ratio (ref 0.0–0.9)

## 2021-06-22 LAB — HEPATITIS B SURFACE ANTIGEN: Hepatitis B Surface Ag: NEGATIVE

## 2021-06-23 LAB — CYTOLOGY - PAP
Adequacy: ABSENT
Comment: NEGATIVE
Diagnosis: NEGATIVE
High risk HPV: NEGATIVE

## 2021-07-02 ENCOUNTER — Ambulatory Visit
Admission: RE | Admit: 2021-07-02 | Discharge: 2021-07-02 | Disposition: A | Discharge status: Home / Self Care | Payer: Medicaid Other | Source: Ambulatory Visit | Attending: Obstetrics and Gynecology | Admitting: Obstetrics and Gynecology

## 2021-07-02 DIAGNOSIS — R1084 Generalized abdominal pain: Secondary | ICD-10-CM

## 2021-07-20 ENCOUNTER — Ambulatory Visit: Payer: Self-pay | Admitting: Family Medicine

## 2021-08-03 ENCOUNTER — Ambulatory Visit (INDEPENDENT_AMBULATORY_CARE_PROVIDER_SITE_OTHER): Payer: Medicaid Other | Admitting: *Deleted

## 2021-08-03 ENCOUNTER — Encounter: Payer: Self-pay | Admitting: Obstetrics

## 2021-08-03 ENCOUNTER — Other Ambulatory Visit: Payer: Self-pay

## 2021-08-03 VITALS — BP 113/71 | HR 77

## 2021-08-03 DIAGNOSIS — Z3200 Encounter for pregnancy test, result unknown: Secondary | ICD-10-CM

## 2021-08-03 DIAGNOSIS — Z3201 Encounter for pregnancy test, result positive: Secondary | ICD-10-CM | POA: Diagnosis not present

## 2021-08-03 DIAGNOSIS — Z3480 Encounter for supervision of other normal pregnancy, unspecified trimester: Secondary | ICD-10-CM

## 2021-08-03 LAB — POCT URINE PREGNANCY: Preg Test, Ur: POSITIVE — AB

## 2021-08-03 MED ORDER — PREPLUS 27-1 MG PO TABS: 1.0000 | ORAL_TABLET | Freq: Every day | ORAL | 13 refills | Status: DC

## 2021-08-03 NOTE — Progress Notes (Signed)
Ms. Maj presents today for UPT. She has no unusual complaints. LMP: 07/02/21    OBJECTIVE: Appears well, in no apparent distress.  OB History     Gravida  4   Para  2   Term  2   Preterm  0   AB  1   Living  2      SAB  1   IAB  0   Ectopic  0   Multiple  0   Live Births  2          Home UPT Result: positive In-Office UPT result: positive I have reviewed the patient's medical, obstetrical, social, and family histories, and medications.   ASSESSMENT: Positive pregnancy test  PLAN Prenatal care to be completed at: [facility]. Patient has experience 2 previous SABs and is fearful of another loss. Support offered. Reassurance that she has carried 2 pregnancies to term. Education provided on location and use of MAU should any symptoms concerning for SAB arise.

## 2021-08-18 ENCOUNTER — Encounter (HOSPITAL_COMMUNITY): Payer: Self-pay | Admitting: Obstetrics and Gynecology

## 2021-08-18 ENCOUNTER — Other Ambulatory Visit: Payer: Self-pay

## 2021-08-18 ENCOUNTER — Inpatient Hospital Stay (HOSPITAL_COMMUNITY): Payer: BC Managed Care – PPO

## 2021-08-18 ENCOUNTER — Telehealth: Payer: Self-pay | Admitting: *Deleted

## 2021-08-18 ENCOUNTER — Inpatient Hospital Stay (HOSPITAL_COMMUNITY)
Admission: AD | Admit: 2021-08-18 | Discharge: 2021-08-18 | Disposition: A | Discharge status: Home / Self Care | Payer: BC Managed Care – PPO | Attending: Obstetrics and Gynecology | Admitting: Obstetrics and Gynecology

## 2021-08-18 DIAGNOSIS — Z3491 Encounter for supervision of normal pregnancy, unspecified, first trimester: Secondary | ICD-10-CM | POA: Diagnosis not present

## 2021-08-18 DIAGNOSIS — O26851 Spotting complicating pregnancy, first trimester: Secondary | ICD-10-CM

## 2021-08-18 DIAGNOSIS — Z3A01 Less than 8 weeks gestation of pregnancy: Secondary | ICD-10-CM | POA: Diagnosis not present

## 2021-08-18 DIAGNOSIS — Z8759 Personal history of other complications of pregnancy, childbirth and the puerperium: Secondary | ICD-10-CM | POA: Diagnosis not present

## 2021-08-18 HISTORY — DX: Depression, unspecified: F32.A

## 2021-08-18 LAB — WET PREP, GENITAL
Sperm: NONE SEEN
Trich, Wet Prep: NONE SEEN
WBC, Wet Prep HPF POC: <10 (ref <10)
Yeast Wet Prep HPF POC: NONE SEEN

## 2021-08-18 LAB — CBC
HCT: 40.3 % (ref 36.0–46.0)
Hemoglobin: 13.2 g/dL (ref 12.0–15.0)
MCH: 29 pg (ref 26.0–34.0)
MCHC: 32.8 g/dL (ref 30.0–36.0)
MCV: 88.6 fL (ref 80.0–100.0)
Platelets: 256 10*3/uL (ref 150–400)
RBC: 4.55 MIL/uL (ref 3.87–5.11)
RDW: 12.7 % (ref 11.5–15.5)
WBC: 11.7 10*3/uL — ABNORMAL HIGH (ref 4.0–10.5)
nRBC: 0 % (ref 0.0–0.2)

## 2021-08-18 LAB — HCG, QUANTITATIVE, PREGNANCY: hCG, Beta Chain, Quant, S: 60111 m[IU]/mL — ABNORMAL HIGH (ref <5)

## 2021-08-18 NOTE — Discharge Instructions (Signed)

## 2021-08-18 NOTE — MAU Note (Signed)
Colleen Patterson is a 35 y.o. at [redacted]w[redacted]d here in MAU reporting: intermittent spotting since yesterday. No pain. No abnormal discharge. No recent IC.  Onset of complaint: yesterday  Pain score: 0/10  Vitals:   08/18/21 1818  BP: 121/71  Pulse: 75  Resp: 18  Temp: 98.1 F (36.7 C)  SpO2: 98%     Lab orders placed from triage: none

## 2021-08-18 NOTE — MAU Provider Note (Signed)
History     CSN: [id number]  Arrival date and time: 08/18/21 1757   Event Date/Time   First Provider Initiated Contact with Patient 08/18/21 1835      Chief Complaint  Patient presents with   Vaginal Bleeding   HPI Colleen Patterson is a 35 y.o. [id number] at [redacted]w[redacted]d who presents to MAU with chief complaint of vaginal spotting.  This is a new problem, onset yesterday 08/17/2021. Patient has not seen any heavy bleeding. She denies pain, dysuria, fever. She is remote from sexual intercourse.  OB History     Gravida  5   Para  2   Term  2   Preterm  0   AB  1   Living  2      SAB  1   IAB  0   Ectopic  0   Multiple  0   Live Births  2           Past Medical History:  Diagnosis Date   Depression    States feeling depressed d/t 2 miscarriages earlier this year (2022)    Past Surgical History:  Procedure Laterality Date   CESAREAN SECTION      Family History  Problem Relation Age of Onset   Healthy Mother    Healthy Father     Social History   Tobacco Use   Smoking status: Never   Smokeless tobacco: Never  Vaping Use   Vaping Use: Never used  Substance Use Topics   Alcohol use: Never   Drug use: Never    Allergies: No Known Allergies  Medications Prior to Admission  Medication Sig Dispense Refill Last Dose   Prenatal Vit-Fe Fumarate-FA (PREPLUS) 27-1 MG TABS Take 1 tablet by mouth daily. 30 tablet 13 08/18/2021 at 1300   Boric Acid CRYS Place 600 mg vaginally at bedtime. Use vaginally for two nights after your period has stopped. 500 g 5    Prenatal Vit-Fe Fumarate-FA (MULTIVITAMIN-PRENATAL) 27-0.8 MG TABS tablet Take 1 tablet by mouth daily at 12 noon.       Review of Systems  Genitourinary:  Positive for vaginal bleeding.  All other systems reviewed and are negative. Physical Exam   Blood pressure 99/64, pulse 86, temperature 98.1 F (36.7 C), temperature source Oral, resp. rate 20, height 5\' 5"  (1.651 m), weight 86.7 kg, last menstrual  period 07/02/2021, SpO2 100 %, unknown if currently breastfeeding.  Physical Exam Vitals and nursing note reviewed. Exam conducted with a chaperone present.  Constitutional:      Appearance: Normal appearance. She is not ill-appearing.  Cardiovascular:     Rate and Rhythm: Normal rate and regular rhythm.     Pulses: Normal pulses.     Heart sounds: Normal heart sounds.  Pulmonary:     Effort: Pulmonary effort is normal.     Breath sounds: Normal breath sounds.  Abdominal:     General: Abdomen is flat.  Genitourinary:    Comments: Deferred Skin:    Capillary Refill: Capillary refill takes less than 2 seconds.  Neurological:     Mental Status: She is alert and oriented to person, place, and time.  Psychiatric:        Mood and Affect: Mood normal.        Behavior: Behavior normal.        Thought Content: Thought content normal.        Judgment: Judgment normal.    MAU Course/MDM  Procedures  --Clue cells on wet prep  without other Amsel Criteria. Will not treat for Bacterial Vaginosis  Orders Placed This Encounter  Procedures   Wet prep, genital   US OB LESS THAN 14 WEEKS WITH OB TRANSVAGINAL   CBC   hCG, quantitative, pregnancy   Urinalysis, Routine w reflex microscopic Urine, Clean Catch   Nursing communication   Patient Vitals for the past 24 hrs:  BP Temp Temp src Pulse Resp SpO2 Height Weight  08/18/21 1935 115/64 -- -- 86 18 100 % -- --  08/18/21 1827 99/64 -- -- 86 20 100 % -- --  08/18/21 1818 121/71 98.1 F (36.7 C) Oral 75 18 98 % -- --  08/18/21 1810 -- -- -- -- -- -- 5\' 5"  (1.651 m) 86.7 kg   Results for orders placed or performed during the hospital encounter of 08/18/21 (from the past 24 hour(s))  CBC     Status: Abnormal   Collection Time: 08/18/21  6:37 PM  Result Value Ref Range   WBC 11.7 (H) 4.0 - 10.5 K/uL   RBC 4.55 3.87 - 5.11 MIL/uL   Hemoglobin 13.2 12.0 - 15.0 g/dL   HCT 08/20/21 99.2 - 42.6 %   MCV 88.6 80.0 - 100.0 fL   MCH 29.0 26.0 -  34.0 pg   MCHC 32.8 30.0 - 36.0 g/dL   RDW 83.4 19.6 - 22.2 %   Platelets 256 150 - 400 K/uL   nRBC 0.0 0.0 - 0.2 %  Wet prep, genital     Status: Abnormal   Collection Time: 08/18/21  6:48 PM   Specimen: PATH Cytology Cervicovaginal Ancillary Only  Result Value Ref Range   Yeast Wet Prep HPF POC NONE SEEN NONE SEEN   Trich, Wet Prep NONE SEEN NONE SEEN   Clue Cells Wet Prep HPF POC PRESENT (A) NONE SEEN   WBC, Wet Prep HPF POC <10 <10   Sperm NONE SEEN    08/20/21 OB LESS THAN 14 WEEKS WITH OB TRANSVAGINAL  Result Date: 08/18/2021 CLINICAL DATA:  Pregnant, vaginal bleeding, pelvic pain. LMP 07/07/2021 EXAM: OBSTETRIC <14 WK 13/04/2021 AND TRANSVAGINAL OB US TECHNIQUE: Both transabdominal and transvaginal ultrasound examinations were performed for complete evaluation of the gestation as well as the maternal uterus, adnexal regions, and pelvic cul-de-sac. Transvaginal technique was performed to assess early pregnancy. COMPARISON:  07/02/2021 FINDINGS: Intrauterine gestational sac: Present, single Yolk sac:  Present, single, normal appearing Embryo:  Present, single Cardiac Activity: Present, regular Heart Rate: 101 bpm MSD: Appropriate given fetal size CRL:  9 mm   6 w   5 d                  13/11/2020 EDC: 04/08/2022 Subchorionic hemorrhage:  None visualized. Maternal uterus/adnexae: The visualized cervix is unremarkable save for a few nabothian cysts within the endocervical canal. No intrauterine masses are seen. There is no free fluid within the cul-de-sac. The maternal ovaries are unremarkable. IMPRESSION: Single living intrauterine gestation with an estimated gestational age of [redacted] weeks, 5 days. Borderline low heart rate. Follow-up sonography in 7-10 days would be helpful to document appropriate progression and continued viability of the intrauterine gestation. Electronically Signed   By: 9-10 M.D.   On: 08/18/2021 19:29    Assessment and Plan  --34 y.o. 34 with live IUP at [redacted]w[redacted]d  --Vaginal  spotting --Blood type A POS --Discharge home in stable condition  [redacted]w[redacted]d, CNM 08/18/2021, 7:42 PM

## 2021-08-18 NOTE — Telephone Encounter (Signed)
Pt states she has had some spotting and is unsure what to do. Pt states she has seen some light pink spotting when she wipes the last couple days. Pt denies any spotting today.  Pt states no recent intercourse. Pt advised she may monitor at home, if increases or severe pain she should be evaluated at hospital. Pt states she will go be seen due to her history and she is very anxious with this pregnancy.

## 2021-08-18 NOTE — Progress Notes (Signed)
GC/Chlamydia and wet prep cultures obtained via pt self swabbing. 

## 2021-08-19 LAB — GC/CHLAMYDIA PROBE AMP (~~LOC~~) NOT AT ARMC
Chlamydia: NEGATIVE
Comment: NEGATIVE
Comment: NORMAL
Neisseria Gonorrhea: NEGATIVE

## 2021-08-20 ENCOUNTER — Inpatient Hospital Stay (HOSPITAL_COMMUNITY)
Admission: AD | Admit: 2021-08-20 | Discharge: 2021-08-20 | Disposition: A | Discharge status: Home / Self Care | Payer: Medicaid Other | Attending: Obstetrics and Gynecology | Admitting: Obstetrics and Gynecology

## 2021-08-20 ENCOUNTER — Other Ambulatory Visit: Payer: Self-pay

## 2021-08-20 DIAGNOSIS — Z6790 Unspecified blood type, Rh positive: Secondary | ICD-10-CM

## 2021-08-20 DIAGNOSIS — O26851 Spotting complicating pregnancy, first trimester: Secondary | ICD-10-CM | POA: Diagnosis present

## 2021-08-20 DIAGNOSIS — Z3A00 Weeks of gestation of pregnancy not specified: Secondary | ICD-10-CM | POA: Diagnosis not present

## 2021-08-20 LAB — HEMOGLOBIN AND HEMATOCRIT, BLOOD
HCT: 42.8 % (ref 36.0–46.0)
Hemoglobin: 14 g/dL (ref 12.0–15.0)

## 2021-08-20 NOTE — MAU Note (Signed)
Presents with c/o VB, reports wearing a panty liner but not saturating.  Denies abdominal pain, just "discomfort."

## 2021-08-20 NOTE — Progress Notes (Signed)
Colleen Patterson, CNM @ bedside performing U/S.  CNM states FHT seen.

## 2021-08-20 NOTE — MAU Provider Note (Signed)
Event Date/Time  First Provider Initiated Contact with Patient 08/20/21 1721     S Ms. Colleen Patterson is a 35 y.o. [id number] patient who presents to MAU today with complaint of vaginal spotting. This is a recurrent problem for which patient was evaluated in MAU two days ago. She states her spotting is becoming heavier. She is not saturating a panty liner or pad. She denies pain. She is remote from sexual intercourse.  O BP 106/64 (BP Location: Right Arm)    Pulse 97    Temp 98.2 F (36.8 C) (Oral)    Resp 18    Ht 5\' 5"  (1.651 m)    Wt 85.7 kg    LMP 07/02/2021 (Exact Date)    SpO2 100%    BMI 31.43 kg/m    Physical Exam Vitals and nursing note reviewed. Exam conducted with a chaperone present.  Constitutional:      Appearance: Normal appearance. She is not ill-appearing.  Cardiovascular:     Rate and Rhythm: Normal rate.  Pulmonary:     Effort: Pulmonary effort is normal.  Abdominal:     General: Abdomen is flat.     Tenderness: There is no abdominal tenderness.  Genitourinary:    Comments: 4-5 dime size dark red spots on panty liner worn to MAU Skin:    Capillary Refill: Capillary refill takes less than 2 seconds.  Neurological:     Mental Status: She is alert and oriented to person, place, and time.  Psychiatric:        Mood and Affect: Mood normal.        Behavior: Behavior normal.        Thought Content: Thought content normal.        Judgment: Judgment normal.   A Medical screening exam complete IUP confirmed with formal 13/11/2020 08/18/2021 IUP with cardiac flicker present on BSUS today Will collect H&H to assess cumulative bleeding Blood type A POS Pelvic rest advised Explained to patient she does not need to put herself on bedrest  Results for orders placed or performed during the hospital encounter of 08/20/21 (from the past 24 hour(s))  Hemoglobin and hematocrit, blood     Status: None   Collection Time: 08/20/21  5:41 PM  Result Value Ref Range   Hemoglobin 14.0 12.0  - 15.0 g/dL   HCT 08/22/21 44.0 - 34.7 %    P Discharge from MAU in stable condition with bleeding precautions   42.5, CNM 08/20/2021 6:13 PM

## 2021-08-24 ENCOUNTER — Telehealth: Payer: Self-pay

## 2021-08-24 DIAGNOSIS — O26851 Spotting complicating pregnancy, first trimester: Secondary | ICD-10-CM

## 2021-08-24 NOTE — Telephone Encounter (Signed)
Pt called stating that she had some continuous brown spotting since 08/20/21. States she did pass a small black clot less than the size of a dime last night. Pt denies any intercourse, pelvic pain, or abnormal bright red bleeding. Pt was seen at MAU on 12/21 and 12/23 for spotting in early pregnancy. Pt is concerned because she is having continued spotting and does not want to go back to the hospital. Please advise.

## 2021-08-24 NOTE — Telephone Encounter (Signed)
Pt advised that she will have a follow up ultrasound on 08/25/21 or 08/26/21 for viability. Order placed in Epic.

## 2021-08-25 ENCOUNTER — Telehealth: Payer: Self-pay | Admitting: Medical

## 2021-08-25 ENCOUNTER — Other Ambulatory Visit: Payer: Self-pay

## 2021-08-25 ENCOUNTER — Ambulatory Visit
Admission: RE | Admit: 2021-08-25 | Discharge: 2021-08-25 | Disposition: A | Discharge status: Home / Self Care | Payer: Medicaid Other | Source: Ambulatory Visit | Attending: Family Medicine | Admitting: Family Medicine

## 2021-08-25 DIAGNOSIS — O26851 Spotting complicating pregnancy, first trimester: Secondary | ICD-10-CM | POA: Insufficient documentation

## 2021-08-25 NOTE — Telephone Encounter (Signed)
I called Colleen Patterson today at 3:27 PM and confirmed patient's identity using two patient identifiers. [place] results from earlier today were reviewed. Patient is scheduled for new OB visit at [facility] on 09/14/21. First trimester warning signs reviewed. Patient voiced understanding and questions were answered. Patient concerned that she continues to have spotting. Advised pelvic rest, avoid heavy lifting and monitor bleeding for worsening. Report heavy bleeding or severe pain to your OB.    US OB LESS THAN 14 WEEKS WITH OB TRANSVAGINAL  Result Date: 08/25/2021 CLINICAL DATA:  First trimester pregnancy with inconclusive fetal viability. Vaginal bleeding. EXAM: OBSTETRIC <14 WK [place] AND TRANSVAGINAL OB US TECHNIQUE: Both transabdominal and transvaginal ultrasound examinations were performed for complete evaluation of the gestation as well as the maternal uterus, adnexal regions, and pelvic cul-de-sac. Transvaginal technique was performed to assess early pregnancy. COMPARISON:  08/18/2021 FINDINGS: Intrauterine gestational sac: Single Yolk sac:  Visualized. Embryo:  Visualized. Cardiac Activity: Visualized. Heart Rate: 172 bpm CRL:  12 mm   7 w   3 d                  [place] EDC: 04/10/2022 Subchorionic hemorrhage:  None visualized. Maternal uterus/adnexae: Both ovaries are normal appearance. No mass or abnormal free fluid identified. IMPRESSION: Single living IUP with estimated gestational age of [redacted] weeks 3 days, and [place] EDC of 04/10/2022. No maternal uterine or adnexal abnormality identified. Electronically Signed   By: Danae Orleans M.D.   On: 08/25/2021 14:35    Marny Lowenstein, PA-C 08/25/2021 3:27 PM

## 2021-09-06 ENCOUNTER — Ambulatory Visit: Payer: Medicaid Other | Admitting: *Deleted

## 2021-09-06 DIAGNOSIS — O0990 Supervision of high risk pregnancy, unspecified, unspecified trimester: Secondary | ICD-10-CM | POA: Insufficient documentation

## 2021-09-06 DIAGNOSIS — Z3480 Encounter for supervision of other normal pregnancy, unspecified trimester: Secondary | ICD-10-CM | POA: Insufficient documentation

## 2021-09-06 MED ORDER — PREPLUS 27-1 MG PO TABS: 1.0000 | ORAL_TABLET | Freq: Every day | ORAL | 13 refills | Status: AC

## 2021-09-06 NOTE — Progress Notes (Signed)
New OB Intake  I connected with  Colleen Patterson on 09/06/21 at  2:00 PM EST by telephone Video Visit and verified that I am speaking with the correct person using two identifiers. Nurse is located at [facility] and pt is located at work.  I discussed the limitations, risks, security and privacy concerns of performing an evaluation and management service by telephone and the availability of in person appointments. I also discussed with the patient that there may be a patient responsible charge related to this service. The patient expressed understanding and agreed to proceed.  I explained I am completing New OB Intake today. We discussed her EDD of 04/08/22 that is based on LMP of 07/02/21. Pt is G5/P2. I reviewed her allergies, medications, Medical/Surgical/OB history, and appropriate screenings. I informed her of [facility] services. Based on history, this is a/an  pregnancy complicated by Prior C/S X 2  .   Patient Active Problem List   Diagnosis Date Noted   Visit for routine gyn exam 06/21/2021   Possible exposure to STD 06/21/2021   Abdominal pain 06/21/2021    Concerns addressed today  Delivery Plans:  Plans to deliver at [facility].   MyChart/Babyscripts (Not completed) .  Blood Pressure Cuff  Patient needs BP cuff unable to complete new OB Intake for instructions on picking up BP cuff and Babyscripts.  Weight scale: Patient    have weight scale. Weight scale ordered   Anatomy US Explained first scheduled [place] will be around 19 weeks. Anatomy US scheduled for 19 wks at [facility]. Pt notified to arrive at [facility].  Labs Discussed Avelina Laine genetic screening with patient. Would like both Panorama and Horizon drawn at new OB visit. Routine prenatal labs needed.  Covid Vaccine Patient has covid vaccine.   Social Determinants of Health Food Insecurity: Patient denies food insecurity. WIC Referral: Patient is interested in referral to [facility].  Transportation: Patient denies transportation  needs. Childcare: Discussed no children allowed at ultrasound appointments. Offered childcare services; patient declines childcare services at this time.  First visit review I reviewed new OB appt with pt. I explained she will have a pelvic exam, ob bloodwork with genetic screening, and PAP smear. Explained pt will be seen by Dr. Debroah Loop at first visit; encounter routed to appropriate provider. Explained that patient will be seen by pregnancy navigator following visit with provider. [facility] information placed in AVS.   Harrel Lemon, RN 09/06/2021  2:34 PM

## 2021-09-14 ENCOUNTER — Other Ambulatory Visit: Payer: Self-pay

## 2021-09-14 ENCOUNTER — Other Ambulatory Visit (HOSPITAL_COMMUNITY)
Admission: RE | Admit: 2021-09-14 | Discharge: 2021-09-14 | Disposition: A | Discharge status: Home / Self Care | Payer: BC Managed Care – PPO | Source: Ambulatory Visit | Attending: Obstetrics & Gynecology | Admitting: Obstetrics & Gynecology

## 2021-09-14 ENCOUNTER — Encounter: Payer: Self-pay | Admitting: Obstetrics

## 2021-09-14 ENCOUNTER — Ambulatory Visit (INDEPENDENT_AMBULATORY_CARE_PROVIDER_SITE_OTHER): Payer: Medicaid Other | Admitting: Obstetrics & Gynecology

## 2021-09-14 ENCOUNTER — Ambulatory Visit (INDEPENDENT_AMBULATORY_CARE_PROVIDER_SITE_OTHER): Payer: BC Managed Care – PPO

## 2021-09-14 VITALS — BP 106/73 | HR 78 | Wt 189.3 lb

## 2021-09-14 DIAGNOSIS — O368390 Maternal care for abnormalities of the fetal heart rate or rhythm, unspecified trimester, not applicable or unspecified: Secondary | ICD-10-CM | POA: Diagnosis not present

## 2021-09-14 DIAGNOSIS — B9689 Other specified bacterial agents as the cause of diseases classified elsewhere: Secondary | ICD-10-CM | POA: Diagnosis not present

## 2021-09-14 DIAGNOSIS — Z3A10 10 weeks gestation of pregnancy: Secondary | ICD-10-CM

## 2021-09-14 DIAGNOSIS — Z3480 Encounter for supervision of other normal pregnancy, unspecified trimester: Secondary | ICD-10-CM | POA: Diagnosis present

## 2021-09-14 DIAGNOSIS — O23599 Infection of other part of genital tract in pregnancy, unspecified trimester: Secondary | ICD-10-CM | POA: Diagnosis not present

## 2021-09-14 DIAGNOSIS — Z3A00 Weeks of gestation of pregnancy not specified: Secondary | ICD-10-CM | POA: Diagnosis not present

## 2021-09-14 DIAGNOSIS — Z98891 History of uterine scar from previous surgery: Secondary | ICD-10-CM | POA: Insufficient documentation

## 2021-09-14 MED ORDER — BLOOD PRESSURE KIT DEVI: 1.0000 | 0 refills | Status: DC

## 2021-09-14 MED ORDER — GOJJI WEIGHT SCALE MISC: 1.0000 | 0 refills | Status: DC

## 2021-09-14 MED ORDER — VITAFOL ULTRA 29-0.6-0.4-200 MG PO CAPS: 1.0000 | ORAL_CAPSULE | Freq: Every day | ORAL | 11 refills | Status: DC

## 2021-09-14 NOTE — Progress Notes (Signed)
Patient presents for New OB Intake. Patient complains of continuing to have intermittent spotting and discolored discharge. 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 Patient is also experiencing lower back pain on her right side.

## 2021-09-14 NOTE — Progress Notes (Signed)
°  Subjective:vaginal discharge    Colleen Patterson is a [id number] [redacted]w[redacted]d being seen today for her first obstetrical visit.  Her obstetrical history is significant for  previous cesarean sections . Patient does intend to breast feed. Pregnancy history fully reviewed.  Patient reports  vaginal discharge .  Vitals:   09/14/21 0814  BP: 106/73  Pulse: 78  Weight: 189 lb 4.8 oz (85.9 kg)    HISTORY: OB History  Gravida Para Term Preterm AB Living  5 2 2  0 2 2  SAB IAB Ectopic Multiple Live Births  2 0 0 0 2    # Outcome Date GA Lbr Len/2nd Weight Sex Delivery Anes PTL Lv  5 Current           4 Term 08/13/13    F CS-LTranv Spinal  LIV  3 Term 05/28/07    F CS-LTranv Spinal  LIV     Complications: Breech birth  2 SAB           1 SAB            Past Medical History:  Diagnosis Date   Depression    States feeling depressed d/t 2 miscarriages earlier this year (2022)   Past Surgical History:  Procedure Laterality Date   CESAREAN SECTION     tummy tuck  2017   Family History  Problem Relation Age of Onset   Healthy Mother    Healthy Father      Exam    Uterus:   10 weeks  Pelvic Exam:    Perineum: No Hemorrhoids   Vulva: normal   Vagina:  Yellow discharge   pH:    Cervix: no lesions   Adnexa: normal adnexa   Bony Pelvis: average  System: Breast:  normal appearance, no masses or tenderness   Skin: normal coloration and turgor, no rashes    Neurologic: oriented, normal mood   Extremities: normal strength, tone, and muscle mass   HEENT PERRLA   Mouth/Teeth mucous membranes moist, pharynx normal without lesions   Neck supple   Cardiovascular: regular rate and rhythm   Respiratory:  appears well, vitals normal, no respiratory distress, acyanotic, normal RR   Abdomen: soft, non-tender; bowel sounds normal; no masses,  no organomegaly   Urinary: urethral meatus normal      Assessment:    Pregnancy: 2018 Patient Active Problem List   Diagnosis Date Noted    History of 2 cesarean sections 09/14/2021   Supervision of other normal pregnancy, antepartum 09/06/2021        Plan:     Initial labs drawn. Prenatal vitamins. Problem list reviewed and updated. Genetic Screening discussed : ordered.  Ultrasound discussed; fetal survey: ordered.  Follow up in 5 weeks. 50% of 30 min visit spent on counseling and coordination of care.  Limited 11/04/2021 c/w dates with [facility]   [place] 09/14/2021

## 2021-09-15 LAB — OBSTETRIC PANEL, INCLUDING HIV
Antibody Screen: NEGATIVE
Basophils Absolute: 0 10*3/uL (ref 0.0–0.2)
Basos: 0 %
EOS (ABSOLUTE): 0.1 10*3/uL (ref 0.0–0.4)
Eos: 1 %
HIV Screen 4th Generation wRfx: NONREACTIVE
Hematocrit: 42.5 % (ref 34.0–46.6)
Hemoglobin: 14.2 g/dL (ref 11.1–15.9)
Hepatitis B Surface Ag: NEGATIVE
Immature Grans (Abs): 0 10*3/uL (ref 0.0–0.1)
Immature Granulocytes: 0 %
Lymphocytes Absolute: 2.7 10*3/uL (ref 0.7–3.1)
Lymphs: 25 %
MCH: 29.6 pg (ref 26.6–33.0)
MCHC: 33.4 g/dL (ref 31.5–35.7)
MCV: 89 fL (ref 79–97)
Monocytes Absolute: 0.5 10*3/uL (ref 0.1–0.9)
Monocytes: 4 %
Neutrophils Absolute: 7.5 10*3/uL — ABNORMAL HIGH (ref 1.4–7.0)
Neutrophils: 70 %
Platelets: 269 10*3/uL (ref 150–450)
RBC: 4.79 x10E6/uL (ref 3.77–5.28)
RDW: 13.1 % (ref 11.7–15.4)
RPR Ser Ql: NONREACTIVE
Rh Factor: POSITIVE
Rubella Antibodies, IGG: 1.51 index (ref >0.99)
WBC: 10.8 10*3/uL (ref 3.4–10.8)

## 2021-09-15 LAB — CERVICOVAGINAL ANCILLARY ONLY
Bacterial Vaginitis (gardnerella): POSITIVE — AB
Candida Glabrata: NEGATIVE
Candida Vaginitis: NEGATIVE
Chlamydia: NEGATIVE
Comment: NEGATIVE
Comment: NEGATIVE
Comment: NEGATIVE
Comment: NEGATIVE
Comment: NEGATIVE
Comment: NORMAL
Neisseria Gonorrhea: NEGATIVE
Trichomonas: NEGATIVE

## 2021-09-15 LAB — HEPATITIS C ANTIBODY: Hep C Virus Ab: <0.1 s/co ratio (ref 0.0–0.9)

## 2021-09-16 LAB — URINE CULTURE, OB REFLEX

## 2021-09-16 LAB — CULTURE, OB URINE

## 2021-09-23 ENCOUNTER — Telehealth: Payer: Self-pay | Admitting: *Deleted

## 2021-09-23 ENCOUNTER — Encounter: Payer: Self-pay | Admitting: *Deleted

## 2021-09-23 MED ORDER — METRONIDAZOLE 500 MG PO TABS: 500.0000 mg | ORAL_TABLET | Freq: Two times a day (BID) | ORAL | 0 refills | Status: AC

## 2021-09-23 NOTE — Telephone Encounter (Signed)
Returned TC to patient regarding +BV in MyChart. Reassured that provider has just sent RX for Flagyl. Patient verbalized understanding. MyChart message with education on BV sent.

## 2021-09-23 NOTE — Addendum Note (Signed)
Addended by: Adam Phenix on: 09/23/2021 04:46 PM   Modules accepted: Orders

## 2021-09-27 ENCOUNTER — Encounter: Payer: Self-pay | Admitting: Obstetrics & Gynecology

## 2021-09-27 ENCOUNTER — Other Ambulatory Visit: Payer: Self-pay

## 2021-09-27 DIAGNOSIS — Z3480 Encounter for supervision of other normal pregnancy, unspecified trimester: Secondary | ICD-10-CM

## 2021-10-11 ENCOUNTER — Inpatient Hospital Stay (HOSPITAL_COMMUNITY)
Admission: AD | Admit: 2021-10-11 | Discharge: 2021-10-11 | Disposition: A | Discharge status: Home / Self Care | Payer: Medicaid Other | Attending: Obstetrics and Gynecology | Admitting: Obstetrics and Gynecology

## 2021-10-11 ENCOUNTER — Other Ambulatory Visit: Payer: Self-pay

## 2021-10-11 ENCOUNTER — Encounter (HOSPITAL_COMMUNITY): Payer: Self-pay | Admitting: Obstetrics and Gynecology

## 2021-10-11 DIAGNOSIS — R8290 Unspecified abnormal findings in urine: Secondary | ICD-10-CM | POA: Diagnosis not present

## 2021-10-11 DIAGNOSIS — Z3A14 14 weeks gestation of pregnancy: Secondary | ICD-10-CM | POA: Diagnosis not present

## 2021-10-11 DIAGNOSIS — O26859 Spotting complicating pregnancy, unspecified trimester: Secondary | ICD-10-CM

## 2021-10-11 DIAGNOSIS — B3731 Acute candidiasis of vulva and vagina: Secondary | ICD-10-CM | POA: Diagnosis not present

## 2021-10-11 DIAGNOSIS — B379 Candidiasis, unspecified: Secondary | ICD-10-CM

## 2021-10-11 DIAGNOSIS — O26892 Other specified pregnancy related conditions, second trimester: Secondary | ICD-10-CM | POA: Diagnosis not present

## 2021-10-11 DIAGNOSIS — O98812 Other maternal infectious and parasitic diseases complicating pregnancy, second trimester: Secondary | ICD-10-CM | POA: Diagnosis present

## 2021-10-11 LAB — CBC
HCT: 40.3 % (ref 36.0–46.0)
Hemoglobin: 13.4 g/dL (ref 12.0–15.0)
MCH: 29.5 pg (ref 26.0–34.0)
MCHC: 33.3 g/dL (ref 30.0–36.0)
MCV: 88.8 fL (ref 80.0–100.0)
Platelets: 266 10*3/uL (ref 150–400)
RBC: 4.54 MIL/uL (ref 3.87–5.11)
RDW: 13 % (ref 11.5–15.5)
WBC: 13.5 10*3/uL — ABNORMAL HIGH (ref 4.0–10.5)
nRBC: 0 % (ref 0.0–0.2)

## 2021-10-11 LAB — URINALYSIS, ROUTINE W REFLEX MICROSCOPIC
Bilirubin Urine: NEGATIVE
Glucose, UA: NEGATIVE mg/dL
Ketones, ur: NEGATIVE mg/dL
Nitrite: NEGATIVE
Protein, ur: NEGATIVE mg/dL
Specific Gravity, Urine: 1.005 (ref 1.005–1.030)
pH: 7 (ref 5.0–8.0)

## 2021-10-11 LAB — WET PREP, GENITAL
Sperm: NONE SEEN
Trich, Wet Prep: NONE SEEN
WBC, Wet Prep HPF POC: >=10 — AB (ref <10)

## 2021-10-11 LAB — GC/CHLAMYDIA PROBE AMP (~~LOC~~) NOT AT ARMC
Chlamydia: NEGATIVE
Comment: NEGATIVE
Comment: NORMAL
Neisseria Gonorrhea: NEGATIVE

## 2021-10-11 MED ORDER — TERCONAZOLE 0.4 % VA CREA: 1.0000 | TOPICAL_CREAM | Freq: Every day | VAGINAL | 0 refills | Status: DC

## 2021-10-11 MED ORDER — ACETAMINOPHEN 325 MG PO TABS
650.0000 mg | ORAL_TABLET | Freq: Once | ORAL | Status: AC
  Administered 2021-10-11: 650 mg via ORAL
  Filled 2021-10-11: qty 2

## 2021-10-11 NOTE — MAU Note (Signed)
Patient given AVS paperwork and verbalized understanding. Patient singed E-signature. Epic restarted after patient had left and E-signature was lost.

## 2021-10-11 NOTE — MAU Note (Signed)
.  Colleen Patterson is a 36 y.o. at [redacted]w[redacted]d here in MAU reporting: vaginal spotting that she first noticed this morning when she was getting ready for bed. She also reports vaginal itching. Reports thick, white "cottage cheese" looking vaginal discharge that started last week. Reports trouble sleeping at night. Reports HA 4/10 with which she got slight relief with Tylenol. Denies LOF. Endorses +FM.   Onset of complaint: 10/11/2021 Pain score: 4/10 Vitals:   10/11/21 0558  BP: 117/64  Pulse: 85  Resp: 18  Temp: 98.5 F (36.9 C)  SpO2: 99%     FHT:160 via doppler

## 2021-10-11 NOTE — MAU Provider Note (Addendum)
°History  °  ° °CSN: [id number] ° °Arrival date and time: 10/11/21 0543 ° ° Event Date/Time  ° First Provider Initiated Contact with Patient 10/11/21 0616   °  ° °Chief Complaint  °Patient presents with  ° Vaginal Bleeding  ° Vaginal Itching  ° °HPI °Colleen Patterson is a 36 y.o. G5P2022 at [redacted]w[redacted]d who presents to MAU with chief complaint of vaginal spotting. This is a recurrent problem, onset in early pregnancy. Patient states her spotting stopped for about one week, then restarted this morning. Most recent intercourse about one week ago. She denies abdominal pain, dysuria, fever or recent illness. ° °Patient also reports thick white "cottage cheese" discharge and vaginal itching, onset last week. Patient states symptoms started after she completed treatment for Bacterial Vaginosis. ° °Patient also c/o anterior headache, pain score 4/10. She took 1 Tylenol at 0200 and did not experience relief.  ° °Patient receives care with [facility]. ° °OB History   ° ° Gravida  °5  ° Para  °2  ° Term  °2  ° Preterm  °0  ° AB  °2  ° Living  °2  °  ° ° SAB  °2  ° IAB  °0  ° Ectopic  °0  ° Multiple  °0  ° Live Births  °2  °   °  °  ° ° °Past Medical History:  °Diagnosis Date  ° Depression   ° States feeling depressed d/t 2 miscarriages earlier this year (2022)  ° ° °Past Surgical History:  °Procedure Laterality Date  ° CESAREAN SECTION    ° tummy tuck  2017  ° ° °Family History  °Problem Relation Age of Onset  ° Healthy Mother   ° Healthy Father   ° ° °Social History  ° °Tobacco Use  ° Smoking status: Never  ° Smokeless tobacco: Never  °Vaping Use  ° Vaping Use: Never used  °Substance Use Topics  ° Alcohol use: Never  ° Drug use: Never  ° ° °Allergies: No Known Allergies ° °Medications Prior to Admission  °Medication Sig Dispense Refill Last Dose  ° Prenat-Fe Poly-Methfol-FA-DHA (VITAFOL ULTRA) 29-0.6-0.4-200 MG CAPS Take 1 capsule by mouth daily. 30 capsule 11 10/11/2021  ° Prenatal Vit-Fe Fumarate-FA (PREPLUS) 27-1 MG TABS Take 1  tablet by mouth daily. 30 tablet 13 10/11/2021  ° Blood Pressure Monitoring (BLOOD PRESSURE KIT) DEVI 1 kit by Does not apply route once a week. 1 each 0   ° Misc. Devices (GOJJI WEIGHT SCALE) MISC 1 Device by Does not apply route every 30 (thirty) days. 1 each 0   ° ° °Review of Systems  °Genitourinary:  Positive for vaginal bleeding and vaginal discharge.  °Neurological:  Positive for headaches.  °All other systems reviewed and are negative. °Physical Exam  ° °Blood pressure (!) 109/59, pulse 86, temperature 98.5 °F (36.9 °C), temperature source Oral, resp. rate 18, height 5' 5" (1.651 m), weight 87.9 kg, last menstrual period 07/02/2021, SpO2 99 %, unknown if currently breastfeeding. ° °Physical Exam °Vitals and nursing note reviewed. Exam conducted with a chaperone present.  °Constitutional:   °   Appearance: Normal appearance.  °Cardiovascular:  °   Rate and Rhythm: Normal rate and regular rhythm.  °   Pulses: Normal pulses.  °   Heart sounds: Normal heart sounds.  °Pulmonary:  °   Effort: Pulmonary effort is normal.  °   Breath sounds: Normal breath sounds.  °Abdominal:  °   General: Abdomen is flat.  °  Genitourinary: °   Comments: Pelvic exam: External genitalia normal, vaginal walls pink and well rugated, cervix visually closed, no lesions noted. Large collections of thick white foul smelling discharge throughout vaginal vault. No bleeding observed ° ° °Skin: °   Capillary Refill: Capillary refill takes less than 2 seconds.  °Neurological:  °   Mental Status: She is alert and oriented to person, place, and time.  °Psychiatric:     °   Mood and Affect: Mood normal.     °   Behavior: Behavior normal.     °   Thought Content: Thought content normal.     °   Judgment: Judgment normal.  ° ° °MAU Course/MDM  °Procedures ° °Orders Placed This Encounter  °Procedures  ° Wet prep, genital  ° Urinalysis, Routine w reflex microscopic Urine, Clean Catch  ° CBC  ° °Patient Vitals for the past 24 hrs: ° BP Temp Temp src  Pulse Resp SpO2 Height Weight  °10/11/21 0613 (!) 109/59 -- -- 86 -- -- -- --  °10/11/21 0558 117/64 98.5 °F (36.9 °C) Oral 85 18 99 % 5' 5" (1.651 m) 87.9 kg  ° °Results for orders placed or performed during the hospital encounter of 10/11/21 (from the past 24 hour(s))  °CBC     Status: Abnormal  ° Collection Time: 10/11/21  5:59 AM  °Result Value Ref Range  ° WBC 13.5 (H) 4.0 - 10.5 K/uL  ° RBC 4.54 3.87 - 5.11 MIL/uL  ° Hemoglobin 13.4 12.0 - 15.0 g/dL  ° HCT 40.3 36.0 - 46.0 %  ° MCV 88.8 80.0 - 100.0 fL  ° MCH 29.5 26.0 - 34.0 pg  ° MCHC 33.3 30.0 - 36.0 g/dL  ° RDW 13.0 11.5 - 15.5 %  ° Platelets 266 150 - 400 K/uL  ° nRBC 0.0 0.0 - 0.2 %  °Wet prep, genital     Status: Abnormal  ° Collection Time: 10/11/21  6:14 AM  °Result Value Ref Range  ° Yeast Wet Prep HPF POC PRESENT (A) NONE SEEN  ° Trich, Wet Prep NONE SEEN NONE SEEN  ° Clue Cells Wet Prep HPF POC PRESENT (A) NONE SEEN  ° WBC, Wet Prep HPF POC >=10 (A) <10  ° Sperm NONE SEEN   ° °Meds ordered this encounter  °Medications  ° acetaminophen (TYLENOL) tablet 650 mg  ° terconazole (TERAZOL 7) 0.4 % vaginal cream  °  Sig: Place 1 applicator vaginally at bedtime. Use for seven days  °  Dispense:  45 g  °  Refill:  0  °  Order Specific Question:   Supervising Provider  °  Answer:   ERVIN, MICHAEL L [[id number]]  ° °Assessment and Plan  °--36 y.o. G5P2022 at [redacted]w[redacted]d  °--FHT 160 by Doppler °--No bleeding on speculum exam °--Vulvovaginal candidiasis °--Hgb 13.4, Blood type A POS °--Advised pelvic rest due to recurrent spotting °--Abnormal UA, no urinary symptoms, urine culture ordered °--Discharge home in stable condition ° °F/U: °Next appointment at [facility] is 02/24 ° °Samantha C Weinhold, MSA, MSN, CNM °10/11/2021, 7:08 AM  °

## 2021-10-12 LAB — CULTURE, OB URINE: Culture: NO GROWTH

## 2021-10-22 ENCOUNTER — Ambulatory Visit (INDEPENDENT_AMBULATORY_CARE_PROVIDER_SITE_OTHER): Payer: Medicaid Other | Admitting: Obstetrics

## 2021-10-22 ENCOUNTER — Other Ambulatory Visit: Payer: Self-pay

## 2021-10-22 ENCOUNTER — Encounter: Payer: Self-pay | Admitting: Obstetrics

## 2021-10-22 VITALS — BP 105/73 | HR 98 | Wt 193.0 lb

## 2021-10-22 DIAGNOSIS — Z3480 Encounter for supervision of other normal pregnancy, unspecified trimester: Secondary | ICD-10-CM

## 2021-10-22 DIAGNOSIS — O26851 Spotting complicating pregnancy, first trimester: Secondary | ICD-10-CM

## 2021-10-22 DIAGNOSIS — B379 Candidiasis, unspecified: Secondary | ICD-10-CM

## 2021-10-22 DIAGNOSIS — K5901 Slow transit constipation: Secondary | ICD-10-CM

## 2021-10-22 DIAGNOSIS — Z98891 History of uterine scar from previous surgery: Secondary | ICD-10-CM

## 2021-10-22 MED ORDER — POLYETHYLENE GLYCOL 3350 17 G PO PACK: 17.0000 g | PACK | Freq: Every day | ORAL | 5 refills | Status: DC

## 2021-10-22 MED ORDER — DOCUSATE SODIUM 100 MG PO CAPS: 100.0000 mg | ORAL_CAPSULE | Freq: Two times a day (BID) | ORAL | 5 refills | Status: DC

## 2021-10-22 NOTE — Progress Notes (Signed)
Pt states she is having some constipation and some spotting when wiping.  Was recently seen in MAU and treated for yeast.

## 2021-10-22 NOTE — Progress Notes (Signed)
Subjective:  Katena Petitjean is a 36 y.o. [id number] at [redacted]w[redacted]d being seen today for ongoing prenatal care.  She is currently monitored for the following issues for this low-risk pregnancy and has Supervision of other normal pregnancy, antepartum and History of 2 cesarean sections on their problem list.  Patient reports  constipation .  Contractions: Not present. Vag. Bleeding: None, Scant.   . Denies leaking of fluid.   The following portions of the patient's history were reviewed and updated as appropriate: allergies, current medications, past family history, past medical history, past social history, past surgical history and problem list. Problem list updated.  Objective:   Vitals:   10/22/21 0853  BP: 105/73  Pulse: 98  Weight: 193 lb (87.5 kg)    Fetal Status: Fetal Heart Rate (bpm): 150         General:  Alert, oriented and cooperative. Patient is in no acute distress.  Skin: Skin is warm and dry. No rash noted.   Cardiovascular: Normal heart rate noted  Respiratory: Normal respiratory effort, no problems with respiration noted  Abdomen: Soft, gravid, appropriate for gestational age. Pain/Pressure: Present     Pelvic:  Cervical exam deferred        Extremities: Normal range of motion.     Mental Status: Normal mood and affect. Normal behavior. Normal judgment and thought content.   Urinalysis:      Assessment and Plan:  Pregnancy: [id number] at [redacted]w[redacted]d  .1. Supervision of other normal pregnancy, antepartum Rx: - AFP, Serum, Open Spina Bifida  2. History of 2 cesarean sections - for repeat C/S  3. Spotting in first trimester - clinically stable  4. Yeast infection, treated  5. Slow transit constipation Rx: - docusate sodium (COLACE) 100 MG capsule; Take 1 capsule (100 mg total) by mouth 2 (two) times daily.  Dispense: 60 capsule; Refill: 5 - polyethylene glycol (MIRALAX / GLYCOLAX) 17 g packet; Take 17 g by mouth at bedtime.  Dispense: 30 each; Refill: 5     Preterm labor  symptoms and general obstetric precautions including but not limited to vaginal bleeding, contractions, leaking of fluid and fetal movement were reviewed in detail with the patient. Please refer to After Visit Summary for other counseling recommendations.   Return in about 1 week (around 10/29/2021) for ROB.   Brock Bad, MD  10/22/21

## 2021-10-24 LAB — AFP, SERUM, OPEN SPINA BIFIDA
AFP MoM: 1.1
AFP Value: 32.9 ng/mL
Gest. Age on Collection Date: 16 weeks
Maternal Age At EDD: 33.7 yr
OSBR Risk 1 IN: 8933
Test Results:: NEGATIVE
Weight: 193 [lb_av]

## 2021-11-12 ENCOUNTER — Ambulatory Visit: Payer: Medicaid Other | Admitting: *Deleted

## 2021-11-12 ENCOUNTER — Other Ambulatory Visit: Payer: Self-pay | Admitting: *Deleted

## 2021-11-12 ENCOUNTER — Other Ambulatory Visit: Payer: Self-pay

## 2021-11-12 ENCOUNTER — Encounter: Payer: Self-pay | Admitting: *Deleted

## 2021-11-12 ENCOUNTER — Ambulatory Visit: Payer: Medicaid Other | Attending: Obstetrics & Gynecology

## 2021-11-12 VITALS — BP 107/59 | HR 81

## 2021-11-12 DIAGNOSIS — Z3480 Encounter for supervision of other normal pregnancy, unspecified trimester: Secondary | ICD-10-CM | POA: Diagnosis not present

## 2021-11-12 DIAGNOSIS — Z363 Encounter for antenatal screening for malformations: Secondary | ICD-10-CM | POA: Diagnosis present

## 2021-11-12 DIAGNOSIS — Z362 Encounter for other antenatal screening follow-up: Secondary | ICD-10-CM

## 2021-11-12 DIAGNOSIS — O99212 Obesity complicating pregnancy, second trimester: Secondary | ICD-10-CM | POA: Insufficient documentation

## 2021-11-12 DIAGNOSIS — Z3A19 19 weeks gestation of pregnancy: Secondary | ICD-10-CM | POA: Diagnosis not present

## 2021-11-12 DIAGNOSIS — O34219 Maternal care for unspecified type scar from previous cesarean delivery: Secondary | ICD-10-CM | POA: Diagnosis not present

## 2021-11-19 ENCOUNTER — Ambulatory Visit (INDEPENDENT_AMBULATORY_CARE_PROVIDER_SITE_OTHER): Payer: Medicaid Other | Admitting: Obstetrics

## 2021-11-19 ENCOUNTER — Encounter: Payer: Self-pay | Admitting: Obstetrics

## 2021-11-19 ENCOUNTER — Other Ambulatory Visit: Payer: Self-pay

## 2021-11-19 VITALS — BP 110/76 | HR 88 | Wt 203.0 lb

## 2021-11-19 DIAGNOSIS — Z98891 History of uterine scar from previous surgery: Secondary | ICD-10-CM

## 2021-11-19 DIAGNOSIS — Z3480 Encounter for supervision of other normal pregnancy, unspecified trimester: Secondary | ICD-10-CM

## 2021-11-19 NOTE — Progress Notes (Signed)
Pt in office for ROB visit. She has concerns about findings at U/S. ?

## 2021-11-19 NOTE — Progress Notes (Signed)
Subjective:  ?Colleen Patterson is a 36 y.o. [id number] at 5916w0d being seen today for ongoing prenatal care.  She is currently monitored for the following issues for this low-risk pregnancy and has Supervision of other normal pregnancy, antepartum and History of 2 cesarean sections on their problem list. ? ?Patient reports no complaints.  Contractions: Not present. Vag. Bleeding: None.  Movement: Present. Denies leaking of fluid.  ? ?The following portions of the patient's history were reviewed and updated as appropriate: allergies, current medications, past family history, past medical history, past social history, past surgical history and problem list. Problem list updated. ? ?Objective:  ? ?Vitals:  ? 11/19/21 0904  ?BP: 110/76  ?Pulse: 88  ?Weight: 203 lb (92.1 kg)  ? ? ?Fetal Status: Fetal Heart Rate (bpm): 154   Movement: Present    ? ?General:  Alert, oriented and cooperative. Patient is in no acute distress.  ?Skin: Skin is warm and dry. No rash noted.   ?Cardiovascular: Normal heart rate noted  ?Respiratory: Normal respiratory effort, no problems with respiration noted  ?Abdomen: Soft, gravid, appropriate for gestational age. Pain/Pressure: Present     ?Pelvic:  Cervical exam deferred        ?Extremities: Normal range of motion.  Edema: None  ?Mental Status: Normal mood and affect. Normal behavior. Normal judgment and thought content.  ? ?Urinalysis:     ? ?  ?US MFM OB DETAIL +14 WK (Accession [id number]) (Order [id number]) ?Imaging ?Date: 11/12/2021 Department: [facility] Released By: Colin RheinGreenidge, Crystal I Authorizing: Adam PhenixArnold, James G, MD  ? ?Exam Status ? ?Status  ?Final [99]  ? ?PACS Intelerad Image Link ? ? Show images for US MFM OB DETAIL +14 WK ?Study Result ? ?Narrative & Impression  ?---------------------------------------------------------------------- ? OBSTETRICS REPORT                       (Signed Final 11/12/2021 11:36  am) ?---------------------------------------------------------------------- ?Patient Info ? ID #:       [id number]                          D.O.B.:  [birth year] (35 yrs) ? Name:       Colleen Patterson                   Visit Date: 11/12/2021 10:04 am ?---------------------------------------------------------------------- ?Performed By ? Attending:        Noralee Spaceavi Shankar MD        Ref. Address:     [address] ?                                                            [address] ?                                                            [place], [place] ?  [address] ? Performed By:     Marcellina Millin       [facility] ? Referred By:      Currie Paris ARNOLD ?                   MD ?---------------------------------------------------------------------- ?Orders ? #  Description                           Code        Ordered By ? 1  [place] MFM OB DETAIL +14 WK               [id number]    JAMES ARNOLD ?---------------------------------------------------------------------- ? #  Order #                     Accession #                Episode # ? 1  [id number]                   [id number]                 [id number] ?---------------------------------------------------------------------- ?Indications ? Obesity complicating pregnancy, second         O99.212 ? trimester (pre-G BMI 30) ? Previous cesarean delivery, antepartum (x2)    O34.219 ? Encounter for antenatal screening for          Z36.3 ? malformations ? [redacted] weeks gestation of pregnancy                Z3A.19 ? LR NIPS - Female, Negative Horizon, Negative ? AFP ?---------------------------------------------------------------------- ?Fetal Evaluation ? Num Of Fetuses:         1 ? Fetal Heart Rate(bpm):   152 ? Cardiac Activity:       Observed ? Presentation:           Transverse, head to maternal left ? Placenta:               Posterior ? P. Cord Insertion:      Visualized, central ? Amniotic Fluid ? AFI FV:      Within normal limits ?                             Largest  Pocket(cm) ?                             6.2 ?---------------------------------------------------------------------- ?Biometry ? BPD:      41.3  mm     G. Age:  18w 4d         29  %    CI:        66.87   %    70 - 86 ?                                                         FL/HC:      18.1   %    16.1 - 18.3 ? HC:      161.8  mm     G. Age:  19w 0d         41  %    HC/AC:      1.12        1.09 - 1.39 ? AC:       145   mm     G. Age:  19w 6d         73  %    FL/BPD:     70.9   % ? FL:       29.3  mm     G. Age:  19w 0d         44  %    FL/AC:      20.2   %    20 - 24 ? HUM:      28.6  mm     G. Age:  19w 2d         57  % ? CER:      19.4  mm     G. Age:  18w 6d         30  % ? NFT:       3.0  mm ? LV:        7.3  mm ? CM:        4.5  mm ? Est. FW:     289  gm    0 lb 10 oz      68  % ?---------------------------------------------------------------------- ?OB History ? Gravidity:    5         Term:   2        Prem:   0        SAB:   2 ? TOP:          0       Ectopic:  0        Living: 2 ?---------------------------------------------------------------------- ?Gestational Age ? LMP:           19w 0d        Date:  07/02/21                 EDD:   04/08/22 ? U/S Today:     19w 1d                                        EDD:   04/07/22 ? Best:  19w 0d     Det. By:  LMP  (07/02/21)          EDD:   04/08/22 ?---------------------------------------------------------------------- ?Anatomy ? Cranium:               Appears normal         LVOT:                   Appears normal ? Cavum:                 Appears normal         Aortic Arch:            Not well visualized ? Ventricles:            Appears normal         Ductal Arch:            Not well  visualized ? Choroid Plexus:        Appears normal         Diaphragm:              Not well visualized ? Cerebellum:            Appears normal         Stomach:                Appears normal, left ?                                                                       sided ? Posterior Fossa:       Appears normal         Abdomen:                Appears normal ? Nuchal Fold:           Appears normal         Abdominal Wall:         Appears nml (cord ?                                                                       insert, abd wall) ? Face:                  Appears normal         Cord Vessels:           Appears normal (3 ?                        (orbits and profile)                           vessel cord) ? Lips:                  Appears normal         Kidneys:  Bilateral UTD ? Palate:                Appears normal         Bladder:                Appears normal ? Thoracic:              Appears normal         Spine:                  Not well visualized ? Heart:                 Not well visualized    Upper Extremities:      Appears normal ? RVOT:                  Not well visualized    Lower Extremities:      Appears normal ? Other:  Fetus appears to be a female. Heels/feet and open hands/5th digits, ?         nasal bone, lenses, 3VV, 3VT visualized. Technically difficult due to ?         maternal habitus and fetal position. ?---------------------------------------------------------------------- ?Cervix Uterus Adnexa ? Cervix ? Normal appearance by transabdominal scan. ? Uterus ? No abnormality visualized. ? Right Ovary ? Within normal limits. ? Left Ovary ? Within normal limits. ? Cul De Sac ? No free fluid seen. ? Adnexa ? No adnexal mass visualized. ?---------------------------------------------------------------------- ?Impression ? G5 P2.  Patient is here for fetal anatomy scan ? On cell-free fetal DNA screening, the risks of fetal ? aneuploidies are not increased .MSAFP screening showed ? low risk  for open-neural tube defects . ? Obstetrical history significant for 2 term cesarean deliveries ? We performed fetal anatomy scan.  Bilateral mild urinary tract ? dilations UTD) measuring 4 mm are seen.  No other makers ? of aneuploidies or fetal structural

## 2021-12-07 ENCOUNTER — Ambulatory Visit: Payer: Medicaid Other

## 2021-12-14 ENCOUNTER — Ambulatory Visit (HOSPITAL_BASED_OUTPATIENT_CLINIC_OR_DEPARTMENT_OTHER): Payer: Medicaid Other

## 2021-12-14 ENCOUNTER — Ambulatory Visit (HOSPITAL_BASED_OUTPATIENT_CLINIC_OR_DEPARTMENT_OTHER): Payer: Medicaid Other | Admitting: Maternal & Fetal Medicine

## 2021-12-14 ENCOUNTER — Other Ambulatory Visit: Payer: Self-pay | Admitting: Obstetrics and Gynecology

## 2021-12-14 ENCOUNTER — Ambulatory Visit: Payer: Medicaid Other | Attending: Obstetrics and Gynecology | Admitting: *Deleted

## 2021-12-14 VITALS — BP 110/67 | HR 91

## 2021-12-14 DIAGNOSIS — O99212 Obesity complicating pregnancy, second trimester: Secondary | ICD-10-CM | POA: Diagnosis not present

## 2021-12-14 DIAGNOSIS — O35EXX0 Maternal care for other (suspected) fetal abnormality and damage, fetal genitourinary anomalies, not applicable or unspecified: Secondary | ICD-10-CM | POA: Diagnosis present

## 2021-12-14 DIAGNOSIS — Z3A23 23 weeks gestation of pregnancy: Secondary | ICD-10-CM

## 2021-12-14 DIAGNOSIS — O283 Abnormal ultrasonic finding on antenatal screening of mother: Secondary | ICD-10-CM

## 2021-12-14 DIAGNOSIS — Z362 Encounter for other antenatal screening follow-up: Secondary | ICD-10-CM

## 2021-12-14 DIAGNOSIS — E669 Obesity, unspecified: Secondary | ICD-10-CM

## 2021-12-14 DIAGNOSIS — O34219 Maternal care for unspecified type scar from previous cesarean delivery: Secondary | ICD-10-CM | POA: Insufficient documentation

## 2021-12-14 DIAGNOSIS — O321XX0 Maternal care for breech presentation, not applicable or unspecified: Secondary | ICD-10-CM | POA: Diagnosis not present

## 2021-12-14 DIAGNOSIS — O99213 Obesity complicating pregnancy, third trimester: Secondary | ICD-10-CM | POA: Insufficient documentation

## 2021-12-14 DIAGNOSIS — Z3480 Encounter for supervision of other normal pregnancy, unspecified trimester: Secondary | ICD-10-CM

## 2021-12-14 NOTE — Progress Notes (Signed)
MFM Brief Note ? ?Ms. Mudry is here for follow up growth at the request of Dr. Emeterio Reeve for suspected placenta previa and bilateral renal pyelectasis. ? ?Normal interval growth with measurements consistent with dates ?Good fetal movement and amniotic fluid volume  ? ?Today we performed a transvaginal ultrasound and observed a vasa previa. ? ?Vasa previa is a rare but potentially catastrophic complication in which fetal vessels run through the fetal membranes and are at risk of rupture with consequent fetal exsanguination. It is estimated that vasa previa affects between 1 in 1275 and 1 in 8333 pregnancies. Vasa previa may occur because the insertion of the umbilical cord into the placenta is velamentous, with the umbilical vessels coursing through the fetal membranes before inserting into the placental disk and the unsupported vessels then overlying the cervix. It may also result from the presence of a bilobed or succenturiate placenta, with the connecting vessels similarly overlying the cervix. If the condition goes unrecognized, it is associated with a fetal mortality rate of almost 60%. In addition to a succenturiate placenta and velamentous insertion, other risk factors include a low-lying placenta observed in the second trimester, multiple gestation, and in vitro fertilization. ? ?It has been suggested that patients be hospitalized at 30 to 32 weeks and delivered at 27 to 36 weeks' gestation without confirmation of lung maturity by amniocentesis. This approach is based on the 10% risk of membrane rupture before labor and the high associated fetal mortality rate. A decision analysis comparing 11 strategies for delivery timing concluded that delivery at 34 to 35 weeks best balanced the risk of prematurity with the risk of neonatal mortality. The patient is administered betamethasone just before 34 weeks' gestation and is delivered by cesarean section between 13 and 35 weeks, without additional testing for  fetal lung maturity. ? ?We will determine if Ms. Mandile be admitted prior to that time, vaginal bleeding remote from delivery is an indication for admission. ? ?I counseled her regarding pelvic rest and to contact her providers and go to the hospital for vaginal bleeding. ? ?Lastly, I conveyed that the renal pyelectasis persist in left renal pelvis. There is mild hydroureter as well. ? ?All questions answered. ? ?I spent 30 minutes with > 50% in face to face consultation. ? ?Vikki Ports, MD ?

## 2021-12-15 ENCOUNTER — Other Ambulatory Visit: Payer: Self-pay | Admitting: *Deleted

## 2021-12-15 DIAGNOSIS — O35EXX0 Maternal care for other (suspected) fetal abnormality and damage, fetal genitourinary anomalies, not applicable or unspecified: Secondary | ICD-10-CM

## 2021-12-17 ENCOUNTER — Ambulatory Visit (INDEPENDENT_AMBULATORY_CARE_PROVIDER_SITE_OTHER): Payer: Medicaid Other | Admitting: Obstetrics

## 2021-12-17 ENCOUNTER — Encounter: Payer: Self-pay | Admitting: Obstetrics

## 2021-12-17 VITALS — BP 116/75 | HR 83 | Wt 205.7 lb

## 2021-12-17 DIAGNOSIS — Z98891 History of uterine scar from previous surgery: Secondary | ICD-10-CM

## 2021-12-17 DIAGNOSIS — K219 Gastro-esophageal reflux disease without esophagitis: Secondary | ICD-10-CM

## 2021-12-17 DIAGNOSIS — Z3480 Encounter for supervision of other normal pregnancy, unspecified trimester: Secondary | ICD-10-CM

## 2021-12-17 MED ORDER — PANTOPRAZOLE SODIUM 40 MG PO TBEC
40.0000 mg | DELAYED_RELEASE_TABLET | Freq: Every day | ORAL | 5 refills | Status: DC
Start: 2021-12-17 — End: 2022-01-28

## 2021-12-17 NOTE — Progress Notes (Signed)
Subjective:  ?Colleen Patterson is a 36 y.o. [id number] at [redacted]w[redacted]d being seen today for ongoing prenatal care.  She is currently monitored for the following issues for this high-risk pregnancy and has Supervision of other normal pregnancy, antepartum and History of 2 cesarean sections on their problem list. ? ?Patient reports heartburn.  Contractions: Not present. Vag. Bleeding: None.  Movement: Present. Denies leaking of fluid.  ? ?The following portions of the patient's history were reviewed and updated as appropriate: allergies, current medications, past family history, past medical history, past social history, past surgical history and problem list. Problem list updated. ? ?Objective:  ? ?Vitals:  ? 12/17/21 0906  ?BP: 116/75  ?Pulse: 83  ?Weight: 205 lb 11.2 oz (93.3 kg)  ? ? ?Fetal Status:     Movement: Present    ? ?General:  Alert, oriented and cooperative. Patient is in no acute distress.  ?Skin: Skin is warm and dry. No rash noted.   ?Cardiovascular: Normal heart rate noted  ?Respiratory: Normal respiratory effort, no problems with respiration noted  ?Abdomen: Soft, gravid, appropriate for gestational age. Pain/Pressure: Absent     ?Pelvic:  Cervical exam deferred        ?Extremities: Normal range of motion.  Edema: Trace  ?Mental Status: Normal mood and affect. Normal behavior. Normal judgment and thought content.  ? ?Urinalysis:     ? ?Assessment and Plan:  ?Pregnancy: [id number] at [redacted]w[redacted]d ? ?1. Supervision of other normal pregnancy, antepartum ? ?2. History of 2 cesarean sections ? ?3. Vasa previa, single or unspecified fetus ?- followed by MFM ? ? ?Preterm labor symptoms and general obstetric precautions including but not limited to vaginal bleeding, contractions, leaking of fluid and fetal movement were reviewed in detail with the patient. ?Please refer to After Visit Summary for other counseling recommendations.  ? ?Return in about 4 weeks (around 01/14/2022) for [facility], 2 hour OGTT. ? ? ?Shelly Bombard, MD   ?12/17/21  ?

## 2021-12-17 NOTE — Progress Notes (Signed)
Pt in office for ROB visit. Pt states at [place] on 12/14/21 they saw a pervia and the MD told her she will be admitted into the hospital 30 wks and deliver at 34 wks. She has concerns about about the plan foe her delivery and having a high risk pregnancy.  ?

## 2021-12-20 ENCOUNTER — Other Ambulatory Visit: Payer: Self-pay

## 2021-12-20 ENCOUNTER — Inpatient Hospital Stay (HOSPITAL_COMMUNITY)
Admission: AD | Admit: 2021-12-20 | Discharge: 2021-12-20 | Disposition: A | Discharge status: Home / Self Care | Payer: Medicaid Other | Attending: Obstetrics and Gynecology | Admitting: Obstetrics and Gynecology

## 2021-12-20 ENCOUNTER — Encounter (HOSPITAL_COMMUNITY): Payer: Self-pay | Admitting: Obstetrics and Gynecology

## 2021-12-20 DIAGNOSIS — O26892 Other specified pregnancy related conditions, second trimester: Secondary | ICD-10-CM | POA: Insufficient documentation

## 2021-12-20 DIAGNOSIS — B3731 Acute candidiasis of vulva and vagina: Secondary | ICD-10-CM | POA: Diagnosis not present

## 2021-12-20 DIAGNOSIS — Z3A24 24 weeks gestation of pregnancy: Secondary | ICD-10-CM

## 2021-12-20 DIAGNOSIS — N898 Other specified noninflammatory disorders of vagina: Secondary | ICD-10-CM

## 2021-12-20 LAB — URINALYSIS, ROUTINE W REFLEX MICROSCOPIC
Bilirubin Urine: NEGATIVE
Glucose, UA: NEGATIVE mg/dL
Hgb urine dipstick: NEGATIVE
Ketones, ur: NEGATIVE mg/dL
Nitrite: NEGATIVE
Protein, ur: NEGATIVE mg/dL
Specific Gravity, Urine: 1.021 (ref 1.005–1.030)
pH: 6 (ref 5.0–8.0)

## 2021-12-20 LAB — WET PREP, GENITAL
Clue Cells Wet Prep HPF POC: NONE SEEN
Sperm: NONE SEEN
Trich, Wet Prep: NONE SEEN
WBC, Wet Prep HPF POC: >=10 — AB (ref <10)
Yeast Wet Prep HPF POC: NONE SEEN

## 2021-12-20 MED ORDER — FLUCONAZOLE 150 MG PO TABS
150.0000 mg | ORAL_TABLET | Freq: Once | ORAL | Status: AC
  Administered 2021-12-20: 150 mg via ORAL
  Filled 2021-12-20: qty 1

## 2021-12-20 MED ORDER — FLUCONAZOLE 150 MG PO TABS: 150.0000 mg | ORAL_TABLET | Freq: Every day | ORAL | 0 refills | Status: DC

## 2021-12-20 NOTE — MAU Note (Signed)
Pt says at 330- when she wiped - had pinkish spots on TP ?None since ?No pain or cramping ?Feels itching - started Thursday - no OTC meds  ?[facility]- called them - has an appointment on Thursday- but can't take anymore  ? ?

## 2021-12-20 NOTE — MAU Provider Note (Signed)
?History  ?  ? ?CSN: [id number] ? ?Arrival date and time: 12/20/21 2030 ? ? Event Date/Time  ? First Provider Initiated Contact with Patient 12/20/21 2132   ?  ? ?Chief Complaint  ?Patient presents with  ? Vaginal Itching  ? ?HPI ? ?Ms.Colleen Patterson is a 36 y.o. female [phone] @ 65w3dwith vasa previa here with 4 days of vaginal itching and thick, white vaginal discharge. Reports seeing a few pink spots on the toilet paper today, however none since. Reports significant vaginal itching that seems to be worsening.  ? ?OB History   ? ? Gravida  ?5  ? Para  ?2  ? Term  ?2  ? Preterm  ?0  ? AB  ?2  ? Living  ?2  ?  ? ? SAB  ?2  ? IAB  ?0  ? Ectopic  ?0  ? Multiple  ?0  ? Live Births  ?2  ?   ?  ?  ? ? ?Past Medical History:  ?Diagnosis Date  ? Depression   ? States feeling depressed d/t 2 miscarriages earlier this year (2022)  ? ? ?Past Surgical History:  ?Procedure Laterality Date  ? CESAREAN SECTION    ? tummy tuck  2017  ? ? ?Family History  ?Problem Relation Age of Onset  ? Healthy Mother   ? Healthy Father   ? ? ?Social History  ? ?Tobacco Use  ? Smoking status: Never  ? Smokeless tobacco: Never  ?Vaping Use  ? Vaping Use: Never used  ?Substance Use Topics  ? Alcohol use: Never  ? Drug use: Never  ? ? ?Allergies: No Known Allergies ? ?Medications Prior to Admission  ?Medication Sig Dispense Refill Last Dose  ? Prenatal Vit-Fe Fumarate-FA (PREPLUS) 27-1 MG TABS Take 1 tablet by mouth daily. 30 tablet 13 12/20/2021 at 1600  ? Blood Pressure Monitoring (BLOOD PRESSURE KIT) DEVI 1 kit by Does not apply route once a week. 1 each 0   ? Misc. Devices (GOJJI WEIGHT SCALE) MISC 1 Device by Does not apply route every 30 (thirty) days. 1 each 0   ? pantoprazole (PROTONIX) 40 MG tablet Take 1 tablet (40 mg total) by mouth daily. 30 tablet 5   ? ?Results for orders placed or performed during the hospital encounter of 12/20/21 (from the past 48 hour(s))  ?Urinalysis, Routine w reflex microscopic Urine, Clean Catch     Status:  Abnormal  ? Collection Time: 12/20/21  8:52 PM  ?Result Value Ref Range  ? Color, Urine YELLOW YELLOW  ? APPearance HAZY (A) CLEAR  ? Specific Gravity, Urine 1.021 1.005 - 1.030  ? pH 6.0 5.0 - 8.0  ? Glucose, UA NEGATIVE NEGATIVE mg/dL  ? Hgb urine dipstick NEGATIVE NEGATIVE  ? Bilirubin Urine NEGATIVE NEGATIVE  ? Ketones, ur NEGATIVE NEGATIVE mg/dL  ? Protein, ur NEGATIVE NEGATIVE mg/dL  ? Nitrite NEGATIVE NEGATIVE  ? Leukocytes,Ua TRACE (A) NEGATIVE  ? RBC / HPF 0-5 0 - 5 RBC/hpf  ? WBC, UA 0-5 0 - 5 WBC/hpf  ? Bacteria, UA RARE (A) NONE SEEN  ? Squamous Epithelial / LPF 0-5 0 - 5  ? Mucus PRESENT   ?  Comment: Performed at [facility] [address] [place] [address] ?Wet prep, genital     Status: Abnormal  ? Collection Time: 12/20/21  9:41 PM  ? Specimen: Vaginal  ?Result Value Ref Range  ? Yeast Wet Prep HPF POC NONE SEEN NONE  SEEN  ? Trich, Wet Prep NONE SEEN NONE SEEN  ? Clue Cells Wet Prep HPF POC NONE SEEN NONE SEEN  ? WBC, Wet Prep HPF POC >=10 (A) <10  ? Sperm NONE SEEN   ?  Comment: Performed at [facility], [place] [address], [place], [place] [address]  ?  ? ?Review of Systems  ?Constitutional:  Negative for fever.  ?Gastrointestinal:  Negative for abdominal pain.  ?Genitourinary:  Positive for vaginal discharge.  ?Physical Exam  ? ?Blood pressure 101/62, pulse 92, temperature (!) 97.5 ?F (36.4 ?C), temperature source Oral, resp. rate 18, height 5' 5.5" (1.664 m), weight 94.7 kg, last menstrual period 07/02/2021, unknown if currently breastfeeding. ? ?Physical Exam ?Constitutional:   ?   General: She is not in acute distress. ?   Appearance: Normal appearance. She is not ill-appearing, toxic-appearing or diaphoretic.  ?Genitourinary: ?   Vagina: Vaginal discharge present.  ?   Comments: Vagina - Small - moderate amount of white vaginal discharge, no odor. Vulva erythema noted.  ?Cervix - No contact bleeding, no active bleeding, no sign of any bleeding.  ?Bimanual exam: deferred   ?GC/Chlam, wet prep done ?Chaperone present for exam.   ?Musculoskeletal:     ?   General: Normal range of motion.  ?Skin: ?   General: Skin is warm.  ?Neurological:  ?   Mental Status: She is alert and oriented to person, place, and time.  ?Psychiatric:     ?   Behavior: Behavior normal.  ? ?Fetal Tracing: ?Baseline: 150 bpm ?Variability: Moderate  ?Accelerations: 10x10 ?Decelerations: None ?Toco: None ? ?MAU Course  ?Procedures ? ?MDM ? ?Wet prep and GC collected ?Urine culture pending ?Diflucan:  given one dose in MAU given her Vasa Previa  ?Reviewed patient with Dr. Damita Dunnings.  ? ?Assessment and Plan  ? ?A: ? ?1. Vaginal irritation   ?2. Vaginal yeast infection   ?3. Vaginal discharge during pregnancy, antepartum   ?4. Vasa previa, single or unspecified fetus   ?5. [redacted] weeks gestation of pregnancy   ?  ?P: ? ?DC Home ?Return to MAU if symptoms worsen ?Continue pelvic rest.  ?Rx: Diflucan, will avoid vaginal inserts/ terazol ? ?Lezlie Lye, NP ?12/22/2021 ?12:34 PM ? ? ?

## 2021-12-21 LAB — GC/CHLAMYDIA PROBE AMP (~~LOC~~) NOT AT ARMC
Chlamydia: NEGATIVE
Comment: NEGATIVE
Comment: NORMAL
Neisseria Gonorrhea: NEGATIVE

## 2021-12-22 LAB — CULTURE, OB URINE: Culture: <10000 — AB

## 2021-12-23 ENCOUNTER — Ambulatory Visit: Payer: Medicaid Other

## 2022-01-05 ENCOUNTER — Other Ambulatory Visit: Payer: Self-pay | Admitting: Maternal & Fetal Medicine

## 2022-01-05 ENCOUNTER — Other Ambulatory Visit: Payer: Self-pay | Admitting: *Deleted

## 2022-01-05 ENCOUNTER — Ambulatory Visit: Payer: Medicaid Other | Attending: Maternal & Fetal Medicine

## 2022-01-05 ENCOUNTER — Ambulatory Visit: Payer: Medicaid Other | Admitting: *Deleted

## 2022-01-05 ENCOUNTER — Encounter: Payer: Self-pay | Admitting: *Deleted

## 2022-01-05 VITALS — BP 115/69 | HR 81

## 2022-01-05 DIAGNOSIS — O35EXX0 Maternal care for other (suspected) fetal abnormality and damage, fetal genitourinary anomalies, not applicable or unspecified: Secondary | ICD-10-CM

## 2022-01-05 DIAGNOSIS — E669 Obesity, unspecified: Secondary | ICD-10-CM | POA: Diagnosis not present

## 2022-01-05 DIAGNOSIS — Z6831 Body mass index (BMI) 31.0-31.9, adult: Secondary | ICD-10-CM | POA: Insufficient documentation

## 2022-01-05 DIAGNOSIS — O99212 Obesity complicating pregnancy, second trimester: Secondary | ICD-10-CM

## 2022-01-05 DIAGNOSIS — O34219 Maternal care for unspecified type scar from previous cesarean delivery: Secondary | ICD-10-CM

## 2022-01-05 DIAGNOSIS — Z3A26 26 weeks gestation of pregnancy: Secondary | ICD-10-CM

## 2022-01-14 ENCOUNTER — Encounter: Payer: Self-pay | Admitting: Obstetrics

## 2022-01-14 ENCOUNTER — Other Ambulatory Visit: Payer: Medicaid Other

## 2022-01-14 ENCOUNTER — Ambulatory Visit (INDEPENDENT_AMBULATORY_CARE_PROVIDER_SITE_OTHER): Payer: Medicaid Other | Admitting: Obstetrics

## 2022-01-14 VITALS — BP 120/77 | HR 85 | Wt 207.0 lb

## 2022-01-14 DIAGNOSIS — O0990 Supervision of high risk pregnancy, unspecified, unspecified trimester: Secondary | ICD-10-CM

## 2022-01-14 DIAGNOSIS — M549 Dorsalgia, unspecified: Secondary | ICD-10-CM

## 2022-01-14 MED ORDER — COMFORT FIT MATERNITY SUPP SM MISC: 0 refills | Status: DC

## 2022-01-14 NOTE — Addendum Note (Signed)
Addended by: Baltazar Najjar A on: 01/14/2022 09:55 AM   Modules accepted: Orders

## 2022-01-14 NOTE — Progress Notes (Signed)
Pt in office for ROB visit. She does not have any concerns at this time.  ?

## 2022-01-14 NOTE — Addendum Note (Signed)
Addended by: Flonnie Hailstone on: 01/14/2022 10:11 AM   Modules accepted: Orders

## 2022-01-14 NOTE — Progress Notes (Addendum)
Subjective:  Colleen Patterson is a 36 y.o. [id number] at [redacted]w[redacted]d being seen today for ongoing prenatal care.  She is currently monitored for the following issues for this high-risk pregnancy and has Supervision of high risk pregnancy, antepartum and History of 2 cesarean sections on their problem list.  Patient reports no complaints.   .  .   . Denies leaking of fluid.   The following portions of the patient's history were reviewed and updated as appropriate: allergies, current medications, past family history, past medical history, past social history, past surgical history and problem list. Problem list updated.  Objective:  There were no vitals filed for this visit.  Fetal Status: Fetal Heart Rate (bpm): 150         General:  Alert, oriented and cooperative. Patient is in no acute distress.  Skin: Skin is warm and dry. No rash noted.   Cardiovascular: Normal heart rate noted  Respiratory: Normal respiratory effort, no problems with respiration noted  Abdomen: Soft, gravid, appropriate for gestational age.       Pelvic:  Cervical exam deferred        Extremities: Normal range of motion.     Mental Status: Normal mood and affect. Normal behavior. Normal judgment and thought content.   Urinalysis:        [place] MFM OB FOLLOW UP (Accession [id number]) (Order [id number]) Imaging Date: 01/05/2022 Department: [facility] Released By: Margaretann Loveless, RDMS Authorizing: Jaynie Collins, MD   Exam Status  Status  Final [99]   PACS Intelerad Image Link   Show images for [place] MFM OB FOLLOW UP Study Result  Narrative & Impression  ----------------------------------------------------------------------  OBSTETRICS REPORT                       (Signed Final 01/05/2022 01:08 pm) ---------------------------------------------------------------------- Patient Info  ID #:       [id number]                          D.O.B.:  01-13-1986 (35 yrs)  Name:        Colleen Patterson                   Visit Date: 01/05/2022 10:25 am ---------------------------------------------------------------------- Performed By  Attending:        Sander Nephew      Ref. Address:     [place], [place]  [id number]  Performed By:     Charlyne PetrinBrittany Pharisien     [facility]  Referred By:      Adam PhenixJAMES G ARNOLD                    MD ---------------------------------------------------------------------- Orders  #  Description                           Code        Ordered By  1  US MFM OB FOLLOW UP                   [id number]    Lin LandsmanORENTHIAN                                                       BOOKER ----------------------------------------------------------------------  #  Order #                     Accession #                Episode #  1  [id number]                   [id number]                 [id number] ---------------------------------------------------------------------- Indications  Vasa previa                                    O69.4XX0  Obesity complicating pregnancy, second         O99.212  trimester (pre-G BMI 30)  Previous cesarean delivery, antepartum (x2)    O34.219  LR NIPS - Female, Negative Horizon, Negative  AFP  Pyelectasis of fetus on prenatal ultrasound    O28.3  [redacted] weeks gestation of pregnancy                Z3A.26 ---------------------------------------------------------------------- Fetal Evaluation  Num Of Fetuses:         1  Fetal Heart Rate(bpm):  153  Cardiac Activity:       Observed  Presentation:  Breech  Placenta:               Vasa Previa  P. Cord Insertion:      Not well visualized  Amniotic Fluid  AFI FV:      Within normal limits                              Largest Pocket(cm)                              6.13 ---------------------------------------------------------------------- Biometry  BPD:      63.9  mm     G. Age:  25w 6d         14  %    CI:        70.17   %    70 - 86                                                          FL/HC:      19.5   %    18.6 - 20.4  HC:      243.3  mm     G. Age:  26w 3d         16  %    HC/AC:      1.05        1.05 - 1.21  AC:      231.3  mm     G. Age:  27w 3d         12  %    FL/BPD:     74.3   %    71 - 87  FL:       47.5  mm     G. Age:  25w 6d         14  %    FL/AC:      20.5   %    20 - 24  HUM:      45.1  mm     G. Age:  26w 5d         46  %  CER:      30.2  mm     G. Age:  26w 2d         53  %  LV:        3.5  mm  CM:        6.5  mm  Est. FW:     974  gm      2 lb 2 oz     38  % ---------------------------------------------------------------------- OB History  Gravidity:    5         Term:   2        Prem:   0        SAB:   2  TOP:          0       Ectopic:  0        Living: 2 ---------------------------------------------------------------------- Gestational Age  LMP:           26w 5d        Date:  07/02/21  EDD:   04/08/22  U/S Today:     26w 3d                                        EDD:   04/10/22  Best:          26w 5d     Det. By:  LMP  (07/02/21)          EDD:   04/08/22 ---------------------------------------------------------------------- Anatomy  Cranium:               Appears normal         LVOT:                   Previously seen  Cavum:                 Appears normal         Aortic Arch:            Previously seen  Ventricles:            Appears normal         Ductal Arch:            Not well visualized  Choroid Plexus:        Previously seen        Diaphragm:              Appears normal   Cerebellum:            Appears normal         Stomach:                Appears normal, left                                                                        sided  Posterior Fossa:       Appears normal         Abdomen:                Appears normal  Nuchal Fold:           Previously seen        Abdominal Wall:         Previously seen  Face:                  Orbits and profile     Cord Vessels:           Appears normal (3                         previously seen                                vessel cord)  Lips:                  Previously seen        Kidneys:                Right UTD 7.3 mm  Palate:  Previously seen        Bladder:                Appears normal  Thoracic:              Appears normal         Spine:                  Previously seen  Heart:                 Previously seen        Upper Extremities:      Previously seen  RVOT:                  Previously seen        Lower Extremities:      Previously seen  Other:  Fetus appears to be a female Heels/feet and open hands/5th digits,          nasal bone, lenses, 3VV, 3VT previously visualized. Technically          difficult due to maternal habitus and fetal position. ---------------------------------------------------------------------- Cervix Uterus Adnexa  Cervix  Length:           3.88  cm.  Normal appearance by transabdominal scan.  Uterus  Normal shape and size.  Right Ovary  Within normal limits.  Left Ovary  Within normal limits. ---------------------------------------------------------------------- Impression  Follow up growth due to renal pyelectasis and known vasa  previa.  Normal interval growth with measurements consistent with  dates  Good fetal movement and amniotic fluid volume  Unilateral UTD is seen in the right kidney of 7.3 mm and the  left kidney is normal today.  We observed the vasa previa again transabdominally. She is  asymptomatic today and without contractions.  She will return in  4 weeks we will consider a transvaginal  exam at that time. We will discuss remaining outpatient vs  inpatient at that time.  Goal for delivery at 35 weeks. ----------------------------------------------------------------------               Sander Nephew, MD Electronically Signed Final Report   01/05/2022 01:08 pm ----------------------------------------------------------------------      Assessment and Plan:  Pregnancy: [id number] at [redacted]w[redacted]d  1. Supervision of high risk pregnancy, antepartum  2. Vasa previa, single or unspecified fetus - clinically stable - probable scheduled C/S at 34-36 weeks, per MFM  3. Backache symptom Rx: - Elastic Bandages & Supports (COMFORT FIT MATERNITY SUPP SM) MISC; Wear as directed.  Dispense: 1 each; Refill: 0    Preterm labor symptoms and general obstetric precautions including but not limited to vaginal bleeding, contractions, leaking of fluid and fetal movement were reviewed in detail with the patient. Please refer to After Visit Summary for other counseling recommendations.   Return in about 2 weeks (around 01/28/2022) for [facility].   Shelly Bombard, MD  01/14/22

## 2022-01-15 LAB — RPR: RPR Ser Ql: NONREACTIVE

## 2022-01-15 LAB — HIV ANTIBODY (ROUTINE TESTING W REFLEX): HIV Screen 4th Generation wRfx: NONREACTIVE

## 2022-01-15 LAB — CBC
Hematocrit: 39.3 % (ref 34.0–46.6)
Hemoglobin: 13 g/dL (ref 11.1–15.9)
MCH: 29.2 pg (ref 26.6–33.0)
MCHC: 33.1 g/dL (ref 31.5–35.7)
MCV: 88 fL (ref 79–97)
Platelets: 266 10*3/uL (ref 150–450)
RBC: 4.45 x10E6/uL (ref 3.77–5.28)
RDW: 12.6 % (ref 11.7–15.4)
WBC: 14.3 10*3/uL — ABNORMAL HIGH (ref 3.4–10.8)

## 2022-01-15 LAB — GLUCOSE TOLERANCE, 2 HOURS W/ 1HR
Glucose, 1 hour: 180 mg/dL — ABNORMAL HIGH (ref 70–179)
Glucose, 2 hour: 105 mg/dL (ref 70–152)
Glucose, Fasting: 87 mg/dL (ref 70–91)

## 2022-01-18 ENCOUNTER — Other Ambulatory Visit: Payer: Self-pay | Admitting: Obstetrics

## 2022-01-18 DIAGNOSIS — O24410 Gestational diabetes mellitus in pregnancy, diet controlled: Secondary | ICD-10-CM

## 2022-01-26 ENCOUNTER — Encounter: Payer: Medicaid Other | Attending: Obstetrics | Admitting: Registered"

## 2022-01-28 ENCOUNTER — Encounter: Payer: Self-pay | Admitting: Obstetrics and Gynecology

## 2022-01-28 ENCOUNTER — Ambulatory Visit (INDEPENDENT_AMBULATORY_CARE_PROVIDER_SITE_OTHER): Payer: Medicaid Other | Admitting: Obstetrics and Gynecology

## 2022-01-28 VITALS — BP 109/73 | HR 82 | Wt 208.5 lb

## 2022-01-28 DIAGNOSIS — O0990 Supervision of high risk pregnancy, unspecified, unspecified trimester: Secondary | ICD-10-CM

## 2022-01-28 DIAGNOSIS — O359XX0 Maternal care for (suspected) fetal abnormality and damage, unspecified, not applicable or unspecified: Secondary | ICD-10-CM | POA: Insufficient documentation

## 2022-01-28 DIAGNOSIS — O24419 Gestational diabetes mellitus in pregnancy, unspecified control: Secondary | ICD-10-CM | POA: Insufficient documentation

## 2022-01-28 DIAGNOSIS — Z98891 History of uterine scar from previous surgery: Secondary | ICD-10-CM

## 2022-01-28 DIAGNOSIS — O24410 Gestational diabetes mellitus in pregnancy, diet controlled: Secondary | ICD-10-CM | POA: Insufficient documentation

## 2022-01-28 MED ORDER — ACCU-CHEK GUIDE ME W/DEVICE KIT: 1.0000 | PACK | Freq: Four times a day (QID) | 0 refills | Status: DC

## 2022-01-28 MED ORDER — ACCU-CHEK GUIDE VI STRP: ORAL_STRIP | 12 refills | Status: DC

## 2022-01-28 MED ORDER — ACCU-CHEK SOFTCLIX LANCETS MISC: 12 refills | Status: DC

## 2022-01-28 NOTE — Progress Notes (Signed)
Subjective:  Colleen Patterson is a 36 y.o. [id number] at [redacted]w[redacted]d being seen today for ongoing prenatal care.  She is currently monitored for the following issues for this high-risk pregnancy and has Supervision of high risk pregnancy, antepartum; History of 2 cesarean sections; Vasa previa; Gestational diabetes; and Fetal abnormality in pregnancy on their problem list.  Patient reports no complaints.  Contractions: Not present. Vag. Bleeding: None.  Movement: Present. Denies leaking of fluid.   The following portions of the patient's history were reviewed and updated as appropriate: allergies, current medications, past family history, past medical history, past social history, past surgical history and problem list. Problem list updated.  Objective:   Vitals:   01/28/22 0844  BP: 109/73  Pulse: 82  Weight: 208 lb 8 oz (94.6 kg)    Fetal Status: Fetal Heart Rate (bpm): 139   Movement: Present     General:  Alert, oriented and cooperative. Patient is in no acute distress.  Skin: Skin is warm and dry. No rash noted.   Cardiovascular: Normal heart rate noted  Respiratory: Normal respiratory effort, no problems with respiration noted  Abdomen: Soft, gravid, appropriate for gestational age. Pain/Pressure: Present     Pelvic:  Cervical exam deferred        Extremities: Normal range of motion.  Edema: None  Mental Status: Normal mood and affect. Normal behavior. Normal judgment and thought content.   Urinalysis:      Assessment and Plan:  Pregnancy: [id number] at [redacted]w[redacted]d  1. Supervision of high risk pregnancy, antepartum Stable  2. History of 2 cesarean sections For repeat at 35-36 weeks  3. Vasa previa, single or unspecified fetus Stable  See above Serial U/S as per MFM  4. Diet controlled gestational diabetes mellitus (GDM) in third trimester GDM and pregnancy reviewed with pt. Reduction of risks with glycemic control reviewed CBG goals reviewed To see DM educator next week  5. Fetal  abnormality affecting management of mother, single or unspecified fetus Stable  Preterm labor symptoms and general obstetric precautions including but not limited to vaginal bleeding, contractions, leaking of fluid and fetal movement were reviewed in detail with the patient. Please refer to After Visit Summary for other counseling recommendations.  Return in about 2 weeks (around 02/11/2022) for OB visit, face to face, MD only.   Chancy Milroy, MD

## 2022-01-28 NOTE — Progress Notes (Signed)
Pt presents

## 2022-01-28 NOTE — Patient Instructions (Signed)
Gestational Diabetes Mellitus, Self-Care When you have gestational diabetes mellitus, you must make sure your blood sugar (glucose) stays at a healthy level. What are the risks? If you do not get treated for this condition, it may cause problems for you and your unborn baby. For the mother Giving birth to the baby early. Having problems during labor and when giving birth. Needing surgery to give birth to the baby (cesarean delivery). Having problems with blood pressure. Getting this form of diabetes again when pregnant. Getting type 2 diabetes in the future. For the baby Low blood sugar. Bigger body size than is normal. Breathing problems. How to monitor blood sugar Check your blood sugar every day while you are pregnant. Check it as often as told by your doctor. To do this: Wash your hands with soap and water for at least 20 seconds. Prick the side of your finger (not the tip) with the lancet. Use a different finger each time. Gently rub the finger until a small drop of blood appears. Follow instructions that come with your meter for: Putting in the test strip. Putting blood on the strip. Getting the result. Write down your result and any notes. In general, your blood sugar levels should be: 95 mg/dL (5.3 mmol/L) if you have not eaten. 140 mg/dL (7.8 mmol/L) 1 hour after a meal. 120 mg/dL (6.7 mmol/L) 2 hours after a meal. Follow these instructions at home: Medicines Take over-the-counter and prescription medicines only as told by your doctor. If your doctor prescribed insulin or other diabetes medicines: Take them every day. Do not run out of insulin or other medicines. Plan ahead so you always have them. Eating and drinking  Follow instructions from your doctor about eating or drinking restrictions. See a food expert (dietician) to help you create an eating plan that helps control your blood sugar. The foods in this plan will include: Low-fat proteins. Dried beans, nuts,  and whole grain breads, cereals, or pasta. Fresh fruits and vegetables. Low-fat dairy products. Healthy fats. Eat healthy snacks between healthy meals. Drink enough fluid to keep your pee (urine) pale yellow. Keep track of carbs that you eat. To do this: Read food labels. Learn the serving sizes of foods. Follow your sick day plan when you cannot eat or drink normally. Make this plan with your doctor so it is ready to use. Activity Do exercises as told by your doctor. Exercise for 30 or more minutes a day, or as much as your doctor recommends. To help you control blood sugar levels after a meal: Do 10 minutes of exercise after each meal. Start this exercise 30 minutes after the meal. Talk with your doctor before you start a new exercise. Your doctor may tell you to change your insulin, other medicines, or food. Lifestyle Do not drink alcohol. Do not use any products that contain nicotine or tobacco, such as cigarettes, e-cigarettes, and chewing tobacco. If you need help quitting, ask your doctor. Learn how to deal with stress. If you need help with this, ask your doctor. Body care Stay up to date with your shots (vaccines). Take good care of your teeth. To do this: Brush your teeth and gums two times a day. Floss one or more times a day. Go to the dentist one or more times every 6 months. Stay at a healthy weight while you are pregnant. General instructions Ask your doctor about risks of high blood pressure in pregnancy. Share your diabetes care plan with: Your work or school. People  you live with. Check your pee for ketones: When you are sick. As told by your doctor. Carry a card or wear a bracelet that says you have diabetes. Keep all follow-up visits. Care after giving birth Have your blood sugar checked 4-12 weeks after you give birth. Get checked for diabetes one or more times every 3 years or as told. Where to find more information American Diabetes Association (ADA):  diabetes.org Association of Diabetes Care & Education Specialists (ADCES): diabeteseducator.org Centers for Disease Control and Prevention (CDC): TonerPromos.no American Pregnancy Association: americanpregnancy.org U.S. Department of Agriculture MyPlate: WrestlingReporter.dk Contact a doctor if: Your blood sugar is above your target for two tests in a row. You have a fever. You are sick for 2 days or more and do not get better. You have either of these problems for more than 6 hours: You vomit every time you eat or drink. You have watery poop (diarrhea). Get help right away if: You cannot think clearly. You have trouble breathing. You have moderate or high ketones in your pee. Blood or abnormal fluid starts to come out of your vagina. You feel your baby is not moving as usual. You start having early contractions. You may feel your belly tighten. You have a very bad headache. These symptoms may be an emergency. Get help right away. Call your local emergency services (911 in the [place] Do not wait to see if the symptoms will go away. Do not drive yourself to the hospital. Summary Check your blood sugar (glucose) while you are pregnant. Check it as often as told by your doctor. Take your insulin and diabetes medicines as told. Have your blood sugar checked 4-12 weeks after you give birth. Keep all follow-up visits. This information is not intended to replace advice given to you by your health care provider. Make sure you discuss any questions you have with your health care provider. Document Revised: 01/20/2020 Document Reviewed: 01/20/2020 Elsevier Patient Education  2023 Elsevier Inc. Gestational Diabetes Mellitus, Diagnosis Gestational diabetes mellitus is a form of diabetes. It can happen when you are pregnant. The diabetes goes away after you give birth. If you do not get treated for this condition, it may cause problems for you or your baby. What are the causes? This condition is caused by  changes in your body when you are pregnant. When these happen: A part of the body called the pancreas does not make enough insulin. The body cannot use insulin in the right way. Sugars cannot get into cells in your body. The sugars stay in your blood. This leads to high blood sugar. What increases the risk? Being older than age 25 when pregnant. Having someone with diabetes in your family. Too much body weight. Having had this condition in the past. Polycystic ovary syndrome. Being pregnant with more than one baby. What are the signs or symptoms? Being thirsty often. Being hungry often. Needing to pee more often. How is this treated? Eat a healthy diet. Get more exercise. Check your blood sugar often. Take insulin and other medicines, if needed. Work with an Financial controller on this condition, if told. Follow these instructions at home: Learn about your diabetes Ask your doctor: How often should I check my blood sugar? Where do I get the equipment? What medicines do I need? When should I take them? Do I need to meet with an educator? Who can I call if I have questions? Where can I find a support group? General instructions Take medicines only as told  by your doctor. Stay at a healthy weight. Drink enough fluid to keep your pee pale yellow. Wear an alert bracelet or carry a card that shows you have this condition. Keep all follow-up visits. Where to find more information American Diabetes Association (ADA): diabetes.org Association of Diabetes Care & Education Specialists (ADCES): diabeteseducator.org Centers for Disease Control and Prevention (CDC): StoreMirror.com.cy American Pregnancy Association: americanpregnancy.org U.S. Department of Agriculture MyPlate: http://www.wilson-mendoza.org/ Contact a doctor if: Your blood sugar is at or above 240 mg/dL (13.3 mmol/L). Your blood sugar is at or above 200 mg/dL (11.1 mmol/L) and you have ketones in your pee. You have a fever. You are sick for 2 days or more and  you do not get better. You have either of these problems for more than 6 hours: You vomit every time you eat or drink. You have watery poop (diarrhea). Get help right away if: You cannot think clearly. You are not breathing well. You have a lot of ketones in your pee. Your baby seems to move less than normal. Abnormal fluid or blood starts to come out of your vagina. You start having contractions before your due date. You may feel your belly tighten. You have a very bad headache. These symptoms may be an emergency. Get help right away. Call your local emergency services (911 in the [place] Do not wait to see if the symptoms will go away. Do not drive yourself to the hospital. Summary Gestational diabetes is a form of diabetes. It can happen when you are pregnant. This condition occurs when your body cannot make or use insulin in the right way. Eat a healthy diet, exercise, and use medicines or insulin as told by your doctor. Tell your doctor if your blood sugar is high, you have a fever, or you vomit every time you eat or drink. Get help right away if you cannot think clearly, you are not breathing well, or your baby seems to move less than normal. This information is not intended to replace advice given to you by your health care provider. Make sure you discuss any questions you have with your health care provider. Document Revised: 01/20/2020 Document Reviewed: 01/20/2020 Elsevier Patient Education  [place].

## 2022-02-02 ENCOUNTER — Encounter: Payer: Medicaid Other | Attending: Obstetrics | Admitting: Registered"

## 2022-02-02 ENCOUNTER — Ambulatory Visit: Payer: Medicaid Other | Admitting: *Deleted

## 2022-02-02 ENCOUNTER — Ambulatory Visit: Payer: Medicaid Other | Attending: Maternal & Fetal Medicine

## 2022-02-02 ENCOUNTER — Ambulatory Visit: Payer: Medicaid Other | Attending: Obstetrics & Gynecology | Admitting: Obstetrics

## 2022-02-02 ENCOUNTER — Other Ambulatory Visit: Payer: Self-pay | Admitting: *Deleted

## 2022-02-02 VITALS — BP 113/71 | HR 90

## 2022-02-02 DIAGNOSIS — O0990 Supervision of high risk pregnancy, unspecified, unspecified trimester: Secondary | ICD-10-CM

## 2022-02-02 DIAGNOSIS — O35EXX0 Maternal care for other (suspected) fetal abnormality and damage, fetal genitourinary anomalies, not applicable or unspecified: Secondary | ICD-10-CM | POA: Diagnosis not present

## 2022-02-02 DIAGNOSIS — O99213 Obesity complicating pregnancy, third trimester: Secondary | ICD-10-CM

## 2022-02-02 DIAGNOSIS — O99212 Obesity complicating pregnancy, second trimester: Secondary | ICD-10-CM | POA: Insufficient documentation

## 2022-02-02 DIAGNOSIS — O34219 Maternal care for unspecified type scar from previous cesarean delivery: Secondary | ICD-10-CM

## 2022-02-02 DIAGNOSIS — Z3A30 30 weeks gestation of pregnancy: Secondary | ICD-10-CM

## 2022-02-02 DIAGNOSIS — E669 Obesity, unspecified: Secondary | ICD-10-CM

## 2022-02-02 DIAGNOSIS — O24410 Gestational diabetes mellitus in pregnancy, diet controlled: Secondary | ICD-10-CM | POA: Insufficient documentation

## 2022-02-02 NOTE — Progress Notes (Signed)
MFM Note  Colleen Patterson was seen for a follow up exam as vasa previa was suspected during her prior ultrasounds.  She was recently diagnosed with diet-controlled gestational diabetes. She denies any vaginal bleeding or contractions.  She was informed that the fetal growth and amniotic fluid level appears appropriate for her gestational age.  Mild bilateral pyelectasis (0.84 cm dilated on right and 0.53 cm dilated on left) continues to be noted on today's exam. The patient was reassured that mild pyelectasis noted on prenatal ultrasounds will most likely resolve after birth.  A normal-appearing posterior placenta is noted.  A succenturiate lobe is not present.  There were no signs of vasa previa noted on today's exam.  These findings were confirmed via transvaginal ultrasound today.  The patient was advised that we will repeat another transvaginal ultrasound in 3 weeks.  Should vasa previa not be noted at that exam, she may undergo a repeat cesarean delivery at between 37 to 39 weeks.  Just to be cautious, she was advised to continue pelvic rest.  We will continue to follow her with weekly NSTs to screen for contractions.  She was advised to go to the hospital should she feel contractions, have any vaginal bleeding, or leakage of fluid.  She will return in 1 week for another NST.  The patient stated that all of her questions were answered today.  A total of 20 minutes was spent counseling and coordinating the care for this patient.  Greater than 50% of the time was spent in direct face-to-face contact.

## 2022-02-07 ENCOUNTER — Encounter: Payer: Self-pay | Admitting: Registered"

## 2022-02-07 NOTE — Progress Notes (Signed)
Patient was seen on 02/02/2022 for Gestational Diabetes self-management class at [facility]. The following learning objectives were met by the patient during this course:  States the definition of Gestational Diabetes States why dietary management is important in controlling blood glucose Describes the effects each nutrient has on blood glucose levels Demonstrates ability to create a balanced meal plan Demonstrates carbohydrate counting  States when to check blood glucose levels Demonstrates proper blood glucose monitoring techniques States the effect of stress and exercise on blood glucose levels States the importance of limiting caffeine and abstaining from alcohol and smoking  Blood glucose monitor given: Accu-chek Guide Me Lot #[id number] Exp: 2023-01-18 CBG: 105 mg/dL   Patient instructed to monitor glucose levels: FBS: 60 - <95; 1 hour: <140; 2 hour: <120  Patient received handouts: Nutrition Diabetes and Pregnancy, including carb counting list  Patient will be seen for follow-up as needed.

## 2022-02-09 ENCOUNTER — Ambulatory Visit: Payer: Medicaid Other | Admitting: *Deleted

## 2022-02-09 ENCOUNTER — Ambulatory Visit: Payer: Medicaid Other | Attending: Obstetrics | Admitting: *Deleted

## 2022-02-09 ENCOUNTER — Encounter: Payer: Self-pay | Admitting: *Deleted

## 2022-02-09 VITALS — BP 104/57 | HR 77

## 2022-02-09 DIAGNOSIS — O99213 Obesity complicating pregnancy, third trimester: Secondary | ICD-10-CM | POA: Diagnosis not present

## 2022-02-09 DIAGNOSIS — O24410 Gestational diabetes mellitus in pregnancy, diet controlled: Secondary | ICD-10-CM | POA: Insufficient documentation

## 2022-02-09 DIAGNOSIS — O0990 Supervision of high risk pregnancy, unspecified, unspecified trimester: Secondary | ICD-10-CM

## 2022-02-09 DIAGNOSIS — Z3A31 31 weeks gestation of pregnancy: Secondary | ICD-10-CM | POA: Diagnosis not present

## 2022-02-09 NOTE — Procedures (Signed)
Colleen Patterson Jul 14, 1986 [redacted]w[redacted]d  Fetus A Non-Stress Test Interpretation for 02/09/22  Indication:  vas previa  Fetal Heart Rate A Mode: External Baseline Rate (A): 145 bpm Variability: Moderate Accelerations: 15 x 15 Decelerations: None Multiple birth?: No  Uterine Activity Mode: Toco Contraction Frequency (min): none Resting Tone Palpated: Relaxed  Interpretation (Fetal Testing) Nonstress Test Interpretation: Reactive Overall Impression: Reassuring for gestational age Comments: tracing reviewed by Dr. Grace Bushy

## 2022-02-11 ENCOUNTER — Encounter: Payer: Self-pay | Admitting: Advanced Practice Midwife

## 2022-02-11 ENCOUNTER — Ambulatory Visit (INDEPENDENT_AMBULATORY_CARE_PROVIDER_SITE_OTHER): Payer: Medicaid Other | Admitting: Advanced Practice Midwife

## 2022-02-11 VITALS — BP 114/74 | HR 84 | Wt 207.2 lb

## 2022-02-11 DIAGNOSIS — Z3A32 32 weeks gestation of pregnancy: Secondary | ICD-10-CM

## 2022-02-11 DIAGNOSIS — O0990 Supervision of high risk pregnancy, unspecified, unspecified trimester: Secondary | ICD-10-CM

## 2022-02-11 NOTE — Progress Notes (Signed)
   PRENATAL VISIT NOTE  Subjective:  Colleen Patterson is a 36 y.o. [id number] at [redacted]w[redacted]d being seen today for ongoing prenatal care.  She is currently monitored for the following issues for this high-risk pregnancy and has Supervision of high risk pregnancy, antepartum; History of 2 cesarean sections; Vasa previa; Diet controlled gestational diabetes mellitus (GDM) in third trimester; and Fetal abnormality in pregnancy on their problem list.  Patient reports no complaints.  Contractions: Irritability. Vag. Bleeding: None.  Movement: Present. Denies leaking of fluid.   The following portions of the patient's history were reviewed and updated as appropriate: allergies, current medications, past family history, past medical history, past social history, past surgical history and problem list.   Objective:   Vitals:   02/11/22 0848  BP: 114/74  Pulse: 84  Weight: 207 lb 3.2 oz (94 kg)    Fetal Status: Fetal Heart Rate (bpm): 138 Fundal Height: 32 cm Movement: Present     General:  Alert, oriented and cooperative. Patient is in no acute distress.  Skin: Skin is warm and dry. No rash noted.   Cardiovascular: Normal heart rate noted  Respiratory: Normal respiratory effort, no problems with respiration noted  Abdomen: Soft, gravid, appropriate for gestational age.  Pain/Pressure: Present     Pelvic: Cervical exam deferred        Extremities: Normal range of motion.  Edema: Trace  Mental Status: Normal mood and affect. Normal behavior. Normal judgment and thought content.    CBG: Fasting: [id number] PP: [id number]  Assessment and Plan:  Pregnancy: [id number] at [redacted]w[redacted]d 1. Supervision of high risk pregnancy, antepartum - routine care, FU MFM as planned   2. [redacted] weeks gestation of pregnancy   Preterm labor symptoms and general obstetric precautions including but not limited to vaginal bleeding,  contractions, leaking of fluid and fetal movement were reviewed in detail with the patient. Please refer to After Visit Summary for other counseling recommendations.   Return in about 2 weeks (around 02/25/2022).  Future Appointments  Date Time Provider Department Center  02/16/2022  1:00 PM [facility] NURSE [facility]  02/16/2022  1:15 PM [facility]  02/23/2022  7:15 AM [facility] NURSE [facility]  02/23/2022  7:30 AM [facility] US2 [facility]  02/23/2022  8:45 AM [facility] NST [facility]  02/25/2022  9:35 AM [name], MD [facility] None  03/11/2022  8:35 AM Anyanwu, Jethro Bastos, MD [facility] None  03/18/2022  8:35 AM Warden Fillers, MD [facility] None  03/25/2022  8:35 AM [name], MD [facility] None  04/01/2022  8:35 AM Adam Phenix, MD [facility] None  04/08/2022  8:35 AM [name], MD [facility] None    Thressa Sheller DNP, CNM  02/11/22  10:33 AM

## 2022-02-11 NOTE — Progress Notes (Signed)
Pt presents for ROB reports low abdominal pain, +FM, denies VB/LOF. CBG readings available per pt

## 2022-02-16 ENCOUNTER — Ambulatory Visit: Payer: Medicaid Other | Attending: Obstetrics | Admitting: Obstetrics and Gynecology

## 2022-02-16 ENCOUNTER — Ambulatory Visit: Payer: Medicaid Other | Admitting: *Deleted

## 2022-02-16 VITALS — BP 100/67 | HR 110

## 2022-02-16 DIAGNOSIS — O35EXX0 Maternal care for other (suspected) fetal abnormality and damage, fetal genitourinary anomalies, not applicable or unspecified: Secondary | ICD-10-CM | POA: Diagnosis not present

## 2022-02-16 DIAGNOSIS — O24410 Gestational diabetes mellitus in pregnancy, diet controlled: Secondary | ICD-10-CM | POA: Insufficient documentation

## 2022-02-16 DIAGNOSIS — Z3A32 32 weeks gestation of pregnancy: Secondary | ICD-10-CM | POA: Diagnosis not present

## 2022-02-16 DIAGNOSIS — Z6831 Body mass index (BMI) 31.0-31.9, adult: Secondary | ICD-10-CM | POA: Diagnosis not present

## 2022-02-16 DIAGNOSIS — O0990 Supervision of high risk pregnancy, unspecified, unspecified trimester: Secondary | ICD-10-CM

## 2022-02-16 NOTE — Procedures (Signed)
Colleen Patterson 03-11-1986 [redacted]w[redacted]d  Fetus A Non-Stress Test Interpretation for 02/16/22  Indication: Gestational Diabetes medication controlled  Fetal Heart Rate A Mode: External Baseline Rate (A): 140 bpm Variability: Moderate Accelerations: 15 x 15 Decelerations: None Multiple birth?: No  Uterine Activity Mode: Toco Contraction Frequency (min): none Resting Tone Palpated: Relaxed  Interpretation (Fetal Testing) Nonstress Test Interpretation: Reactive Overall Impression: Reassuring for gestational age Comments: tracing reviewed by Dr. Judeth Cornfield

## 2022-02-23 ENCOUNTER — Ambulatory Visit: Payer: Medicaid Other | Attending: Obstetrics

## 2022-02-23 ENCOUNTER — Ambulatory Visit (HOSPITAL_BASED_OUTPATIENT_CLINIC_OR_DEPARTMENT_OTHER): Payer: Medicaid Other | Admitting: *Deleted

## 2022-02-23 ENCOUNTER — Other Ambulatory Visit: Payer: Self-pay | Admitting: *Deleted

## 2022-02-23 ENCOUNTER — Ambulatory Visit: Payer: Medicaid Other | Admitting: *Deleted

## 2022-02-23 ENCOUNTER — Encounter: Payer: Self-pay | Admitting: *Deleted

## 2022-02-23 VITALS — BP 112/70 | HR 88

## 2022-02-23 DIAGNOSIS — Z3A33 33 weeks gestation of pregnancy: Secondary | ICD-10-CM

## 2022-02-23 DIAGNOSIS — O0990 Supervision of high risk pregnancy, unspecified, unspecified trimester: Secondary | ICD-10-CM | POA: Diagnosis present

## 2022-02-23 DIAGNOSIS — O34219 Maternal care for unspecified type scar from previous cesarean delivery: Secondary | ICD-10-CM | POA: Insufficient documentation

## 2022-02-23 DIAGNOSIS — O283 Abnormal ultrasonic finding on antenatal screening of mother: Secondary | ICD-10-CM

## 2022-02-23 DIAGNOSIS — O99213 Obesity complicating pregnancy, third trimester: Secondary | ICD-10-CM | POA: Insufficient documentation

## 2022-02-23 DIAGNOSIS — O24419 Gestational diabetes mellitus in pregnancy, unspecified control: Secondary | ICD-10-CM | POA: Insufficient documentation

## 2022-02-23 DIAGNOSIS — O24410 Gestational diabetes mellitus in pregnancy, diet controlled: Secondary | ICD-10-CM

## 2022-02-23 DIAGNOSIS — E669 Obesity, unspecified: Secondary | ICD-10-CM

## 2022-02-23 DIAGNOSIS — O35EXX0 Maternal care for other (suspected) fetal abnormality and damage, fetal genitourinary anomalies, not applicable or unspecified: Secondary | ICD-10-CM

## 2022-02-23 DIAGNOSIS — O24013 Pre-existing diabetes mellitus, type 1, in pregnancy, third trimester: Secondary | ICD-10-CM

## 2022-02-23 NOTE — Procedures (Signed)
Colleen Patterson 1985/11/30 [redacted]w[redacted]d  Fetus A Non-Stress Test Interpretation for 02/23/22  Indication:  gestational diabetic diet controlled  Fetal Heart Rate A Mode: External Baseline Rate (A): 140 bpm Variability: Moderate Accelerations: 15 x 15 Decelerations: None Multiple birth?: No  Uterine Activity Mode: Toco Contraction Frequency (min): none Resting Tone Palpated: Relaxed  Interpretation (Fetal Testing) Nonstress Test Interpretation: Reactive Overall Impression: Reassuring for gestational age Comments: tracing reviewed by Dr. Grace Bushy

## 2022-02-25 ENCOUNTER — Encounter: Payer: Self-pay | Admitting: Obstetrics and Gynecology

## 2022-02-25 ENCOUNTER — Ambulatory Visit (INDEPENDENT_AMBULATORY_CARE_PROVIDER_SITE_OTHER): Payer: Medicaid Other | Admitting: Obstetrics and Gynecology

## 2022-02-25 VITALS — BP 106/70 | HR 81 | Wt 211.0 lb

## 2022-02-25 DIAGNOSIS — O0990 Supervision of high risk pregnancy, unspecified, unspecified trimester: Secondary | ICD-10-CM

## 2022-02-25 DIAGNOSIS — O24410 Gestational diabetes mellitus in pregnancy, diet controlled: Secondary | ICD-10-CM

## 2022-02-25 DIAGNOSIS — O359XX0 Maternal care for (suspected) fetal abnormality and damage, unspecified, not applicable or unspecified: Secondary | ICD-10-CM

## 2022-02-25 DIAGNOSIS — Z98891 History of uterine scar from previous surgery: Secondary | ICD-10-CM

## 2022-02-25 NOTE — Progress Notes (Signed)
Pt presents for ROB. CBG reading available Next growth u/s 03/23/22

## 2022-02-25 NOTE — Progress Notes (Signed)
Subjective:  Colleen Patterson is a 36 y.o. [id number] at [redacted]w[redacted]d being seen today for ongoing prenatal care.  She is currently monitored for the following issues for this high-risk pregnancy and has Supervision of high risk pregnancy, antepartum; History of 2 cesarean sections; Vasa previa; Diet controlled gestational diabetes mellitus (GDM) in third trimester; and Fetal abnormality in pregnancy on their problem list.  Patient reports general discomforts of pregnancy.  Contractions: Not present. Vag. Bleeding: None.  Movement: Present. Denies leaking of fluid.   The following portions of the patient's history were reviewed and updated as appropriate: allergies, current medications, past family history, past medical history, past social history, past surgical history and problem list. Problem list updated.  Objective:   Vitals:   02/25/22 0928  BP: 106/70  Pulse: 81  Weight: 211 lb (95.7 kg)    Fetal Status: Fetal Heart Rate (bpm): 136   Movement: Present     General:  Alert, oriented and cooperative. Patient is in no acute distress.  Skin: Skin is warm and dry. No rash noted.   Cardiovascular: Normal heart rate noted  Respiratory: Normal respiratory effort, no problems with respiration noted  Abdomen: Soft, gravid, appropriate for gestational age. Pain/Pressure: Absent     Pelvic:  Cervical exam deferred        Extremities: Normal range of motion.  Edema: Trace  Mental Status: Normal mood and affect. Normal behavior. Normal judgment and thought content.   Urinalysis:      Assessment and Plan:  Pregnancy: [id number] at [redacted]w[redacted]d  1. Supervision of high risk pregnancy, antepartum Stable GBS and vaginal cultures next visit  2. Diet controlled gestational diabetes mellitus (GDM) in third trimester CBG's in goal range except for a few fasting related to eating late. Reviewed with pt Growth scan scheudled  3. Fetal abnormality affecting management of mother, single or unspecified fetus F/U scan  scheudled  4. History of 2 cesarean sections For repeat at 39 weeks Request to schedule sent  5. Vasa previa, single or unspecified fetus Resolved  Preterm labor symptoms and general obstetric precautions including but not limited to vaginal bleeding, contractions, leaking of fluid and fetal movement were reviewed in detail with the patient. Please refer to After Visit Summary for other counseling recommendations.  Return in about 2 weeks (around 03/11/2022) for OB visit, face to face, MD only.   [name], MD

## 2022-02-25 NOTE — Patient Instructions (Signed)

## 2022-03-11 ENCOUNTER — Ambulatory Visit (INDEPENDENT_AMBULATORY_CARE_PROVIDER_SITE_OTHER): Payer: Medicaid Other | Admitting: Obstetrics & Gynecology

## 2022-03-11 ENCOUNTER — Other Ambulatory Visit (HOSPITAL_COMMUNITY)
Admission: RE | Admit: 2022-03-11 | Discharge: 2022-03-11 | Disposition: A | Discharge status: Home / Self Care | Payer: Medicaid Other | Source: Ambulatory Visit | Attending: Obstetrics & Gynecology | Admitting: Obstetrics & Gynecology

## 2022-03-11 VITALS — BP 111/73 | HR 98 | Wt 210.0 lb

## 2022-03-11 DIAGNOSIS — O0990 Supervision of high risk pregnancy, unspecified, unspecified trimester: Secondary | ICD-10-CM | POA: Insufficient documentation

## 2022-03-11 DIAGNOSIS — Z3A36 36 weeks gestation of pregnancy: Secondary | ICD-10-CM | POA: Insufficient documentation

## 2022-03-11 DIAGNOSIS — O24410 Gestational diabetes mellitus in pregnancy, diet controlled: Secondary | ICD-10-CM

## 2022-03-11 DIAGNOSIS — Z98891 History of uterine scar from previous surgery: Secondary | ICD-10-CM

## 2022-03-11 DIAGNOSIS — O0993 Supervision of high risk pregnancy, unspecified, third trimester: Secondary | ICD-10-CM

## 2022-03-11 NOTE — Patient Instructions (Signed)
Return to office for any scheduled appointments. Call the office or go to the MAU at [facility] if: You begin to have strong, frequent contractions Your water breaks.  Sometimes it is a big gush of fluid, sometimes it is just a trickle that keeps getting your underwear wet or running down your legs You have vaginal bleeding.  It is normal to have a small amount of spotting if your cervix was checked.  You do not feel your baby moving like normal.  If you do not, get something to eat and drink and lay down and focus on feeling your baby move.   If your baby is still not moving like normal, you should call the office or go to MAU. Any other obstetric concerns.  

## 2022-03-11 NOTE — Progress Notes (Signed)
HROB 36 weeks GBS and GC/CC today No complaints from patient. Declines SVE. Anxiety and depression screen negative.

## 2022-03-11 NOTE — Progress Notes (Signed)
   PRENATAL VISIT NOTE  Subjective:  Colleen Patterson is a 36 y.o. [id number] at [redacted]w[redacted]d being seen today for ongoing prenatal care.  She is currently monitored for the following issues for this high-risk pregnancy and has Supervision of high risk pregnancy, antepartum; History of 2 cesarean sections; Diet controlled gestational diabetes mellitus (GDM) in third trimester; and Fetal abnormality in pregnancy on their problem list.  Patient reports no complaints.  Contractions: Not present. Vag. Bleeding: None.  Movement: Present. Denies leaking of fluid.   The following portions of the patient's history were reviewed and updated as appropriate: allergies, current medications, past family history, past medical history, past social history, past surgical history and problem list.   Objective:   Vitals:   03/11/22 0839  BP: 111/73  Pulse: 98  Weight: 210 lb (95.3 kg)    Fetal Status: Fetal Heart Rate (bpm): 133   Movement: Present     General:  Alert, oriented and cooperative. Patient is in no acute distress.  Skin: Skin is warm and dry. No rash noted.   Cardiovascular: Normal heart rate noted  Respiratory: Normal respiratory effort, no problems with respiration noted  Abdomen: Soft, gravid, appropriate for gestational age.  Pain/Pressure: Present     Pelvic: Cervical exam deferred but cultures obtained in presence of chaperone        Extremities: Normal range of motion.  Edema: Trace  Mental Status: Normal mood and affect. Normal behavior. Normal judgment and thought content.   Assessment and Plan:  Pregnancy: [id number] at [redacted]w[redacted]d 1. Diet controlled gestational diabetes mellitus (GDM) in third trimester CBGs within range, continue diet and exercise control. Next growth scan on 03/23/22.   2. History of 2 cesarean sections Already scheduled for RCS  3. [redacted] weeks gestation of pregnancy 4. Supervision of high risk pregnancy, antepartum Pelvic cultures done today, will follow up results and manage  accordingly. - Culture, beta strep (group b only) - Cervicovaginal ancillary only( [place]) Preterm labor symptoms and general obstetric precautions including but not limited to vaginal bleeding, contractions, leaking of fluid and fetal movement were reviewed in detail with the patient. Please refer to After Visit Summary for other counseling recommendations.   Return in about 1 week (around 03/18/2022) for OFFICE OB VISIT (MD only).  Future Appointments  Date Time Provider Department Center  03/18/2022  8:35 AM Warden Fillers, MD [facility] None  03/23/2022 10:30 AM [facility] NURSE [facility]  03/23/2022 10:45 AM [facility] US6 [facility]  03/25/2022  8:35 AM [name], MD [facility] None  04/01/2022  8:35 AM Adam Phenix, MD [facility] None  04/08/2022  8:35 AM [name], MD [facility] None    Jaynie Collins, MD

## 2022-03-14 LAB — CERVICOVAGINAL ANCILLARY ONLY
Chlamydia: NEGATIVE
Comment: NEGATIVE
Comment: NORMAL
Neisseria Gonorrhea: NEGATIVE

## 2022-03-14 LAB — CULTURE, BETA STREP (GROUP B ONLY): Strep Gp B Culture: POSITIVE — AB

## 2022-03-17 ENCOUNTER — Encounter (HOSPITAL_COMMUNITY): Payer: Self-pay

## 2022-03-17 ENCOUNTER — Telehealth (HOSPITAL_COMMUNITY): Payer: Self-pay | Admitting: *Deleted

## 2022-03-17 NOTE — Patient Instructions (Signed)
Manasa Nees  03/17/2022   Your procedure is scheduled on:  04/02/2022  Arrive at 0745 at Entrance C on [employer] at [facility]  and [employer]. You are invited to use the FREE valet parking or use the Visitor's parking deck.  Pick up the phone at the desk and dial [phone].  Call this number if you have problems the morning of surgery: [phone]  Remember:   Do not eat food:(After Midnight) Desps de medianoche.  Do not drink clear liquids: (After Midnight) Desps de medianoche.  Take these medicines the morning of surgery with A SIP OF WATER:  none   Do not wear jewelry, make-up or nail polish.  Do not wear lotions, powders, or perfumes. Do not wear deodorant.  Do not shave 48 hours prior to surgery.  Do not bring valuables to the hospital.  [facility] is not   responsible for any belongings or valuables brought to the hospital.  Contacts, dentures or bridgework may not be worn into surgery.  Leave suitcase in the car. After surgery it may be brought to your room.  For patients admitted to the hospital, checkout time is 11:00 AM the day of              discharge.      Please read over the following fact sheets that you were given:     Preparing for Surgery

## 2022-03-17 NOTE — Telephone Encounter (Signed)
Preadmission screen  

## 2022-03-18 ENCOUNTER — Ambulatory Visit (INDEPENDENT_AMBULATORY_CARE_PROVIDER_SITE_OTHER): Payer: Medicaid Other | Admitting: Obstetrics and Gynecology

## 2022-03-18 ENCOUNTER — Telehealth (HOSPITAL_COMMUNITY): Payer: Self-pay | Admitting: *Deleted

## 2022-03-18 ENCOUNTER — Encounter (HOSPITAL_COMMUNITY): Payer: Self-pay

## 2022-03-18 VITALS — BP 107/74 | HR 106 | Wt 213.0 lb

## 2022-03-18 DIAGNOSIS — O0993 Supervision of high risk pregnancy, unspecified, third trimester: Secondary | ICD-10-CM

## 2022-03-18 DIAGNOSIS — Z3A37 37 weeks gestation of pregnancy: Secondary | ICD-10-CM

## 2022-03-18 DIAGNOSIS — O0990 Supervision of high risk pregnancy, unspecified, unspecified trimester: Secondary | ICD-10-CM

## 2022-03-18 DIAGNOSIS — O24410 Gestational diabetes mellitus in pregnancy, diet controlled: Secondary | ICD-10-CM

## 2022-03-18 DIAGNOSIS — Z98891 History of uterine scar from previous surgery: Secondary | ICD-10-CM

## 2022-03-18 DIAGNOSIS — O359XX0 Maternal care for (suspected) fetal abnormality and damage, unspecified, not applicable or unspecified: Secondary | ICD-10-CM

## 2022-03-18 NOTE — Telephone Encounter (Signed)
Preadmission screen  

## 2022-03-18 NOTE — Progress Notes (Signed)
   PRENATAL VISIT NOTE  Subjective:  Colleen Patterson is a 36 y.o. [id number] at [redacted]w[redacted]d being seen today for ongoing prenatal care.  She is currently monitored for the following issues for this high-risk pregnancy and has Supervision of high risk pregnancy, antepartum; History of 2 cesarean sections; Diet controlled gestational diabetes mellitus (GDM) in third trimester; and Fetal abnormality in pregnancy on their problem list.  Patient doing well with no acute concerns today. She reports no complaints.  Contractions: Irritability. Vag. Bleeding: None.  Movement: Present. Denies leaking of fluid.   The following portions of the patient's history were reviewed and updated as appropriate: allergies, current medications, past family history, past medical history, past social history, past surgical history and problem list. Problem list updated.  Objective:   Vitals:   03/18/22 0834  BP: 107/74  Pulse: (!) 106  Weight: 213 lb (96.6 kg)    Fetal Status: Fetal Heart Rate (bpm): 131 Fundal Height: 38 cm Movement: Present     General:  Alert, oriented and cooperative. Patient is in no acute distress.  Skin: Skin is warm and dry. No rash noted.   Cardiovascular: Normal heart rate noted  Respiratory: Normal respiratory effort, no problems with respiration noted  Abdomen: Soft, gravid, appropriate for gestational age.  Pain/Pressure: Absent     Pelvic: Cervical exam deferred        Extremities: Normal range of motion.     Mental Status:  Normal mood and affect. Normal behavior. Normal judgment and thought content.   Assessment and Plan:  Pregnancy: [id number] at [redacted]w[redacted]d  1. [redacted] weeks gestation of pregnancy   2. Diet controlled gestational diabetes mellitus (GDM) in third trimester FBS: 83-102 PPBS: 87-125  Continue diabetic diet at this time  3. Supervision of high risk pregnancy, antepartum Continue routine prenatal care  4. History of 2 cesarean sections Repeat c section at 39 weeks, pt  considering copper IUD  5. Fetal abnormality affecting management of mother, single or unspecified fetus Renal pyelecstasis noted on previous scan  Term labor symptoms and general obstetric precautions including but not limited to vaginal bleeding, contractions, leaking of fluid and fetal movement were reviewed in detail with the patient.  Please refer to After Visit Summary for other counseling recommendations.   Return in about 1 week (around 03/25/2022) for [facility], in person.   Mariel Aloe, MD Faculty Attending Center for [facility]

## 2022-03-18 NOTE — Progress Notes (Signed)
ROB [redacted] wks GA Reports UC's recently

## 2022-03-23 ENCOUNTER — Ambulatory Visit: Payer: Medicaid Other | Admitting: *Deleted

## 2022-03-23 ENCOUNTER — Ambulatory Visit: Payer: Medicaid Other | Attending: Maternal & Fetal Medicine

## 2022-03-23 VITALS — BP 111/71 | HR 92

## 2022-03-23 DIAGNOSIS — O99213 Obesity complicating pregnancy, third trimester: Secondary | ICD-10-CM

## 2022-03-23 DIAGNOSIS — O34219 Maternal care for unspecified type scar from previous cesarean delivery: Secondary | ICD-10-CM | POA: Diagnosis not present

## 2022-03-23 DIAGNOSIS — O283 Abnormal ultrasonic finding on antenatal screening of mother: Secondary | ICD-10-CM | POA: Diagnosis present

## 2022-03-23 DIAGNOSIS — E669 Obesity, unspecified: Secondary | ICD-10-CM

## 2022-03-23 DIAGNOSIS — O0990 Supervision of high risk pregnancy, unspecified, unspecified trimester: Secondary | ICD-10-CM | POA: Insufficient documentation

## 2022-03-23 DIAGNOSIS — O24013 Pre-existing diabetes mellitus, type 1, in pregnancy, third trimester: Secondary | ICD-10-CM | POA: Diagnosis present

## 2022-03-23 DIAGNOSIS — Z3A37 37 weeks gestation of pregnancy: Secondary | ICD-10-CM

## 2022-03-25 ENCOUNTER — Ambulatory Visit (INDEPENDENT_AMBULATORY_CARE_PROVIDER_SITE_OTHER): Payer: Medicaid Other | Admitting: Obstetrics and Gynecology

## 2022-03-25 ENCOUNTER — Encounter: Payer: Self-pay | Admitting: Obstetrics and Gynecology

## 2022-03-25 VITALS — BP 105/71 | HR 85 | Wt 213.8 lb

## 2022-03-25 DIAGNOSIS — Z98891 History of uterine scar from previous surgery: Secondary | ICD-10-CM

## 2022-03-25 DIAGNOSIS — O24410 Gestational diabetes mellitus in pregnancy, diet controlled: Secondary | ICD-10-CM

## 2022-03-25 DIAGNOSIS — O0990 Supervision of high risk pregnancy, unspecified, unspecified trimester: Secondary | ICD-10-CM

## 2022-03-25 DIAGNOSIS — O359XX0 Maternal care for (suspected) fetal abnormality and damage, unspecified, not applicable or unspecified: Secondary | ICD-10-CM

## 2022-03-25 DIAGNOSIS — O99820 Streptococcus B carrier state complicating pregnancy: Secondary | ICD-10-CM | POA: Insufficient documentation

## 2022-03-25 NOTE — Progress Notes (Signed)
Pt presents for ROB reports HA's x 2 weeks.  CBG readings available per pt

## 2022-03-25 NOTE — Progress Notes (Signed)
Subjective:  Colleen Patterson is a 36 y.o. [id number] at [redacted]w[redacted]d being seen today for ongoing prenatal care.  She is currently monitored for the following issues for this high-risk pregnancy and has Supervision of high risk pregnancy, antepartum; History of 2 cesarean sections; Diet controlled gestational diabetes mellitus (GDM) in third trimester; Fetal abnormality in pregnancy; and GBS (group B Streptococcus carrier), +RV culture, currently pregnant on their problem list.  Patient reports general discomforts of pregnancy.  Contractions: Irritability. Vag. Bleeding: None.  Movement: Present. Denies leaking of fluid.   The following portions of the patient's history were reviewed and updated as appropriate: allergies, current medications, past family history, past medical history, past social history, past surgical history and problem list. Problem list updated.  Objective:   Vitals:   03/25/22 0856  BP: 105/71  Pulse: 85  Weight: 213 lb 12.8 oz (97 kg)    Fetal Status: Fetal Heart Rate (bpm): 135   Movement: Present     General:  Alert, oriented and cooperative. Patient is in no acute distress.  Skin: Skin is warm and dry. No rash noted.   Cardiovascular: Normal heart rate noted  Respiratory: Normal respiratory effort, no problems with respiration noted  Abdomen: Soft, gravid, appropriate for gestational age. Pain/Pressure: Present     Pelvic:  Cervical exam deferred        Extremities: Normal range of motion.  Edema: Trace  Mental Status: Normal mood and affect. Normal behavior. Normal judgment and thought content.   Urinalysis:      Assessment and Plan:  Pregnancy: [id number] at [redacted]w[redacted]d  1. Supervision of high risk pregnancy, antepartum Stable Labor precautions  2. Diet controlled gestational diabetes mellitus (GDM) in third trimester CBG's in goal range Normal growth Continue with diet  3. History of 2 cesarean sections For repeat at 39 weeks  4. Fetal abnormality affecting  management of mother, single or unspecified fetus UTD stable, evaluation post natal by peds  5. GBS (group B Streptococcus carrier), +RV culture, currently pregnant Tx while in labor  Term labor symptoms and general obstetric precautions including but not limited to vaginal bleeding, contractions, leaking of fluid and fetal movement were reviewed in detail with the patient. Please refer to After Visit Summary for other counseling recommendations.  Return in about 1 week (around 04/01/2022) for OB visit, face to face, any provider.   [name], MD

## 2022-03-25 NOTE — Patient Instructions (Signed)
Cesarean Delivery, Care After The following information offers guidance on how to care for yourself after your procedure. Your health care provider may also give you more specific instructions. If you have problems or questions, contact your health care provider. What can I expect after the procedure? After the procedure, it is common to have: A small amount of blood or clear fluid coming from the incision. Some redness, swelling, and pain in your incision area. Some abdominal pain and soreness. Vaginal bleeding (lochia). Even though you did not have a vaginal delivery, you will still have vaginal bleeding and discharge. Pelvic cramps. Fatigue. You may have pain, swelling, and discomfort in the tissue between your vagina and your anus (perineum) if: Your C-section was unplanned, and you were allowed to labor and push. An incision was made in the area (episiotomy) or the tissue tore during attempted vaginal delivery. Follow these instructions at home: Medicines Take over-the-counter and prescription medicines only as told by your health care provider. If you were prescribed an antibiotic medicine, take it as told by your health care provider. Do not stop taking the antibiotic even if you start to feel better. Ask your health care provider if the medicine prescribed to you requires you to avoid driving or using machinery. Incision care  Follow instructions from your health care provider about how to take care of your incision. Make sure you: Wash your hands with soap and water for an least 20 seconds before and after you change your bandage (dressing). If soap and water are not available, use hand sanitizer. If you have a dressing, change it or remove it as told by your health care provider. Leave stitches (sutures), skin staples, skin glue, or adhesive strips in place. These skin closures may need to stay in place for 2 weeks or longer. If adhesive strip edges start to loosen and curl up, you  may trim the loose edges. Do not remove adhesive strips completely unless your health care provider tells you to do that. Check your incision area every day for signs of infection. Check for: More redness, swelling, or pain. More fluid or blood. Warmth. Pus or a bad smell. Do not take baths, swim, or use a hot tub until your health care provider approves. Ask your health care provider if you may take showers. When you cough or sneeze, hug a pillow. This helps with pain and decreases the chance of your incision opening up (dehiscing). Do this until your incision heals. Managing constipation Your procedure may cause constipation. To prevent or treat constipation, you may need to: Drink enough fluid to keep your urine pale yellow. Take over-the-counter or prescription medicines. Eat foods that are high in fiber, such as beans, whole grains, and fresh fruits and vegetables. Limit foods that are high in fat and processed sugars, such as fried or sweet foods. Activity  If possible, have someone help you care for your baby and help with household activities for at least a few days after you leave the hospital. Rest as much as possible. Try to rest or take a nap while your baby is sleeping. You may have to avoid lifting. Ask your health care provider how much you can safely lift. Return to your normal activities as told by your health care provider. Ask your health care provider what activities are safe for you. Talk with your health care provider about when you can engage in sexual activity. This may depend on your: Risk of infection. How fast you heal. Comfort   and desire to engage in sexual activity. Lifestyle Do not drink alcohol. This is especially important if you are breastfeeding or taking pain medicine. Do not use any products that contain nicotine or tobacco. These products include cigarettes, chewing tobacco, and vaping devices, such as e-cigarettes. If you need help quitting, ask your  health care provider. General instructions Do not use tampons or douches until your health care provider approves. Wear loose, comfortable clothing and a supportive and well-fitting bra. If you pass a blood clot, save it and call your health care provider to discuss. Do not flush blood clots down the toilet before you get instructions from your health care provider. Keep all follow-up visits for you and your baby. This is important. Contact a health care provider if: You have: A fever. Dizziness or light-headedness. Bad-smelling vaginal discharge. A blood clot pass from your vagina. Pus, blood, or a bad smell coming from your incision. An incision that feels warm to the touch. More redness, swelling, or pain around your incision. Difficulty or pain when urinating. Nausea or vomiting. Little or no interest in activities you used to enjoy. Your breasts turn red or become painful or hard. You feel unusually sad or worried. You have questions about caring for yourself or your baby. You have redness, swelling, and pain in an arm or leg. Get help right away if: You have: Pain that does not go away or get better with medicine. Chest pain. Trouble breathing. Blurred vision, spots, or flashing lights in your vision. Thoughts about hurting yourself or your baby. New pain in your abdomen or in one of your legs. A severe headache that does not get better with pain medicine. You faint. You bleed from your vagina so much that you fill more than one sanitary pad in one hour. Bleeding should not be heavier than your heaviest period. These symptoms may be an emergency. Get help right away. Call 911. Do not wait to see if the symptoms will go away. Do not drive yourself to the hospital. Get help right away if you feel like you may hurt yourself or others, or have thoughts about taking your own life. Go to your nearest emergency room or: Call 911. Call the National Suicide Prevention Lifeline at  1-800-273-8255 or 988. This is open 24 hours a day. Text the Crisis Text Line at 741741. Summary After the procedure, it is common to have pain at your incision site, abdominal cramping, and slight bleeding from your vagina. Check your incision area every day for signs of infection. Tell your health care provider about any unusual symptoms. Keep all follow-up visits for you and your baby. This is important. This information is not intended to replace advice given to you by your health care provider. Make sure you discuss any questions you have with your health care provider. Document Revised: 03/17/2021 Document Reviewed: 03/17/2021 Elsevier Patient Education  2023 Elsevier Inc.  

## 2022-03-30 ENCOUNTER — Other Ambulatory Visit: Payer: Self-pay | Admitting: Obstetrics & Gynecology

## 2022-03-30 DIAGNOSIS — Z98891 History of uterine scar from previous surgery: Secondary | ICD-10-CM

## 2022-03-31 ENCOUNTER — Other Ambulatory Visit (HOSPITAL_COMMUNITY)
Admission: RE | Admit: 2022-03-31 | Discharge: 2022-03-31 | Disposition: A | Discharge status: Home / Self Care | Payer: Medicaid Other | Source: Ambulatory Visit | Attending: Obstetrics and Gynecology | Admitting: Obstetrics and Gynecology

## 2022-03-31 DIAGNOSIS — Z01812 Encounter for preprocedural laboratory examination: Secondary | ICD-10-CM | POA: Diagnosis present

## 2022-03-31 DIAGNOSIS — Z98891 History of uterine scar from previous surgery: Secondary | ICD-10-CM | POA: Insufficient documentation

## 2022-03-31 HISTORY — DX: Gestational diabetes mellitus in pregnancy, unspecified control: O24.419

## 2022-03-31 LAB — CBC
HCT: 42.7 % (ref 36.0–46.0)
Hemoglobin: 14 g/dL (ref 12.0–15.0)
MCH: 28.9 pg (ref 26.0–34.0)
MCHC: 32.8 g/dL (ref 30.0–36.0)
MCV: 88 fL (ref 80.0–100.0)
Platelets: 201 10*3/uL (ref 150–400)
RBC: 4.85 MIL/uL (ref 3.87–5.11)
RDW: 14.2 % (ref 11.5–15.5)
WBC: 10.8 10*3/uL — ABNORMAL HIGH (ref 4.0–10.5)
nRBC: 0 % (ref 0.0–0.2)

## 2022-03-31 LAB — RPR: RPR Ser Ql: NONREACTIVE

## 2022-03-31 LAB — TYPE AND SCREEN
ABO/RH(D): A POS
Antibody Screen: NEGATIVE

## 2022-04-01 ENCOUNTER — Ambulatory Visit (INDEPENDENT_AMBULATORY_CARE_PROVIDER_SITE_OTHER): Payer: Medicaid Other | Admitting: Obstetrics & Gynecology

## 2022-04-01 VITALS — BP 116/78 | HR 87 | Wt 215.7 lb

## 2022-04-01 DIAGNOSIS — O0990 Supervision of high risk pregnancy, unspecified, unspecified trimester: Secondary | ICD-10-CM

## 2022-04-01 DIAGNOSIS — Z98891 History of uterine scar from previous surgery: Secondary | ICD-10-CM

## 2022-04-01 DIAGNOSIS — O24410 Gestational diabetes mellitus in pregnancy, diet controlled: Secondary | ICD-10-CM

## 2022-04-01 DIAGNOSIS — Z3A39 39 weeks gestation of pregnancy: Secondary | ICD-10-CM

## 2022-04-01 NOTE — Progress Notes (Signed)
   PRENATAL VISIT NOTE  Subjective:  Colleen Patterson is a 36 y.o. [id number] at [redacted]w[redacted]d being seen today for ongoing prenatal care.  She is currently monitored for the following issues for this high-risk pregnancy and has Supervision of high risk pregnancy, antepartum; History of 2 cesarean sections; Diet controlled gestational diabetes mellitus (GDM) in third trimester; Fetal abnormality in pregnancy; and GBS (group B Streptococcus carrier), +RV culture, currently pregnant on their problem list.  Patient reports no complaints.  Contractions: Irritability. Vag. Bleeding: None.  Movement: Present. Denies leaking of fluid.   The following portions of the patient's history were reviewed and updated as appropriate: allergies, current medications, past family history, past medical history, past social history, past surgical history and problem list.   Objective:   Vitals:   04/01/22 0839  BP: 116/78  Pulse: 87  Weight: 215 lb 11.2 oz (97.8 kg)    Fetal Status: Fetal Heart Rate (bpm): 154   Movement: Present     General:  Alert, oriented and cooperative. Patient is in no acute distress.  Skin: Skin is warm and dry. No rash noted.   Cardiovascular: Normal heart rate noted  Respiratory: Normal respiratory effort, no problems with respiration noted  Abdomen: Soft, gravid, appropriate for gestational age.  Pain/Pressure: Present     Pelvic: Cervical exam deferred        Extremities: Normal range of motion.     Mental Status: Normal mood and affect. Normal behavior. Normal judgment and thought content.   Assessment and Plan:  Pregnancy: [id number] at [redacted]w[redacted]d 1. Supervision of high risk pregnancy, antepartum Delivery tomorrow  2. History of 2 cesarean sections RCS, plans IUD intraop, Paragard  3. Diet controlled gestational diabetes mellitus (GDM) in third trimester Diet control  Term labor symptoms and general obstetric precautions including but not limited to vaginal bleeding, contractions,  leaking of fluid and fetal movement were reviewed in detail with the patient. Please refer to After Visit Summary for other counseling recommendations.   Return if symptoms worsen or fail to improve.  Future Appointments  Date Time Provider Department Center  04/08/2022  8:35 AM [name], MD [facility] None    Scheryl Darter, MD

## 2022-04-02 ENCOUNTER — Inpatient Hospital Stay (HOSPITAL_COMMUNITY): Payer: Medicaid Other | Admitting: Certified Registered"

## 2022-04-02 ENCOUNTER — Inpatient Hospital Stay (HOSPITAL_COMMUNITY)
Admission: RE | Admit: 2022-04-02 | Discharge: 2022-04-04 | DRG: 788 | Disposition: A | Discharge status: Home / Self Care | Payer: Medicaid Other | Attending: Obstetrics & Gynecology | Admitting: Obstetrics & Gynecology

## 2022-04-02 ENCOUNTER — Other Ambulatory Visit: Payer: Self-pay

## 2022-04-02 ENCOUNTER — Encounter (HOSPITAL_COMMUNITY): Payer: Self-pay | Admitting: Obstetrics & Gynecology

## 2022-04-02 ENCOUNTER — Encounter (HOSPITAL_COMMUNITY)
Admission: RE | Disposition: A | Discharge status: Home / Self Care | Payer: Self-pay | Source: Home / Self Care | Attending: Obstetrics & Gynecology

## 2022-04-02 DIAGNOSIS — O359XX0 Maternal care for (suspected) fetal abnormality and damage, unspecified, not applicable or unspecified: Secondary | ICD-10-CM | POA: Diagnosis present

## 2022-04-02 DIAGNOSIS — Z98891 History of uterine scar from previous surgery: Principal | ICD-10-CM

## 2022-04-02 DIAGNOSIS — Z975 Presence of (intrauterine) contraceptive device: Secondary | ICD-10-CM

## 2022-04-02 DIAGNOSIS — O24420 Gestational diabetes mellitus in childbirth, diet controlled: Secondary | ICD-10-CM | POA: Diagnosis present

## 2022-04-02 DIAGNOSIS — O0990 Supervision of high risk pregnancy, unspecified, unspecified trimester: Secondary | ICD-10-CM

## 2022-04-02 DIAGNOSIS — Z23 Encounter for immunization: Secondary | ICD-10-CM

## 2022-04-02 DIAGNOSIS — Z3A39 39 weeks gestation of pregnancy: Secondary | ICD-10-CM

## 2022-04-02 DIAGNOSIS — O24410 Gestational diabetes mellitus in pregnancy, diet controlled: Secondary | ICD-10-CM | POA: Diagnosis present

## 2022-04-02 DIAGNOSIS — Z30430 Encounter for insertion of intrauterine contraceptive device: Secondary | ICD-10-CM | POA: Diagnosis not present

## 2022-04-02 DIAGNOSIS — O99824 Streptococcus B carrier state complicating childbirth: Secondary | ICD-10-CM | POA: Diagnosis present

## 2022-04-02 DIAGNOSIS — O358XX0 Maternal care for other (suspected) fetal abnormality and damage, not applicable or unspecified: Secondary | ICD-10-CM | POA: Diagnosis present

## 2022-04-02 DIAGNOSIS — O99820 Streptococcus B carrier state complicating pregnancy: Secondary | ICD-10-CM

## 2022-04-02 DIAGNOSIS — O34211 Maternal care for low transverse scar from previous cesarean delivery: Secondary | ICD-10-CM | POA: Diagnosis present

## 2022-04-02 DIAGNOSIS — O35EXX1 Maternal care for other (suspected) fetal abnormality and damage, fetal genitourinary anomalies, fetus 1: Secondary | ICD-10-CM | POA: Diagnosis not present

## 2022-04-02 LAB — ABO/RH: ABO/RH(D): A POS

## 2022-04-02 LAB — GLUCOSE, CAPILLARY
Glucose-Capillary: 90 mg/dL (ref 70–99)
Glucose-Capillary: 101 mg/dL — ABNORMAL HIGH (ref 70–99)
Glucose-Capillary: 88 mg/dL (ref 70–99)

## 2022-04-02 SURGERY — Surgical Case
Anesthesia: Spinal

## 2022-04-02 MED ORDER — LACTATED RINGERS IV SOLN: INTRAVENOUS | Status: DC

## 2022-04-02 MED ORDER — BUPIVACAINE IN DEXTROSE 0.75-8.25 % IT SOLN
INTRATHECAL | Status: DC | PRN
  Administered 2022-04-02: 1.6 mL via INTRATHECAL

## 2022-04-02 MED ORDER — PARAGARD INTRAUTERINE COPPER IU IUD
INTRAUTERINE_SYSTEM | INTRAUTERINE | Status: AC
  Filled 2022-04-02: qty 1

## 2022-04-02 MED ORDER — PHENYLEPHRINE HCL (PRESSORS) 10 MG/ML IV SOLN
INTRAVENOUS | Status: DC | PRN
  Administered 2022-04-02: 120 ug via INTRAVENOUS

## 2022-04-02 MED ORDER — PARAGARD INTRAUTERINE COPPER IU IUD
1.0000 | INTRAUTERINE_SYSTEM | Freq: Once | INTRAUTERINE | Status: AC
  Administered 2022-04-02: 1 via INTRAUTERINE

## 2022-04-02 MED ORDER — TRANEXAMIC ACID-NACL 1000-0.7 MG/100ML-% IV SOLN
1000.0000 mg | INTRAVENOUS | Status: AC
  Administered 2022-04-02: 1000 mg via INTRAVENOUS

## 2022-04-02 MED ORDER — OXYTOCIN-SODIUM CHLORIDE 30-0.9 UT/500ML-% IV SOLN: 2.5000 [IU]/h | INTRAVENOUS | Status: AC

## 2022-04-02 MED ORDER — MEASLES, MUMPS & RUBELLA VAC IJ SOLR: 0.5000 mL | Freq: Once | INTRAMUSCULAR | Status: DC

## 2022-04-02 MED ORDER — OXYCODONE HCL 5 MG/5ML PO SOLN: 5.0000 mg | Freq: Once | ORAL | Status: DC | PRN

## 2022-04-02 MED ORDER — OXYTOCIN-SODIUM CHLORIDE 30-0.9 UT/500ML-% IV SOLN
INTRAVENOUS | Status: DC | PRN
  Administered 2022-04-02: 350 mL via INTRAVENOUS

## 2022-04-02 MED ORDER — MORPHINE SULFATE (PF) 0.5 MG/ML IJ SOLN
INTRAMUSCULAR | Status: DC | PRN
  Administered 2022-04-02: 150 ug via INTRATHECAL

## 2022-04-02 MED ORDER — DIPHENHYDRAMINE HCL 25 MG PO CAPS: 25.0000 mg | ORAL_CAPSULE | ORAL | Status: DC | PRN

## 2022-04-02 MED ORDER — NALOXONE HCL 4 MG/10ML IJ SOLN: 1.0000 ug/kg/h | INTRAVENOUS | Status: DC | PRN

## 2022-04-02 MED ORDER — ENOXAPARIN SODIUM 60 MG/0.6ML IJ SOSY
50.0000 mg | PREFILLED_SYRINGE | INTRAMUSCULAR | Status: DC
  Administered 2022-04-03: 50 mg via SUBCUTANEOUS
  Filled 2022-04-02: qty 0.6

## 2022-04-02 MED ORDER — ONDANSETRON HCL 4 MG/2ML IJ SOLN
INTRAMUSCULAR | Status: AC
  Filled 2022-04-02: qty 2

## 2022-04-02 MED ORDER — HYDROMORPHONE HCL 1 MG/ML IJ SOLN: 1.0000 mg | INTRAMUSCULAR | Status: DC | PRN

## 2022-04-02 MED ORDER — LACTATED RINGERS IV SOLN: INTRAVENOUS | Status: DC | PRN

## 2022-04-02 MED ORDER — ACETAMINOPHEN 500 MG PO TABS
1000.0000 mg | ORAL_TABLET | Freq: Four times a day (QID) | ORAL | Status: DC
  Administered 2022-04-02 – 2022-04-04 (×7): 1000 mg via ORAL
  Filled 2022-04-02 (×7): qty 2

## 2022-04-02 MED ORDER — MEPERIDINE HCL 25 MG/ML IJ SOLN: 6.2500 mg | INTRAMUSCULAR | Status: DC | PRN

## 2022-04-02 MED ORDER — ZOLPIDEM TARTRATE 5 MG PO TABS: 5.0000 mg | ORAL_TABLET | Freq: Every evening | ORAL | Status: DC | PRN

## 2022-04-02 MED ORDER — ONDANSETRON HCL 4 MG/2ML IJ SOLN
INTRAMUSCULAR | Status: DC | PRN
  Administered 2022-04-02: 4 mg via INTRAVENOUS

## 2022-04-02 MED ORDER — EPHEDRINE SULFATE-NACL 50-0.9 MG/10ML-% IV SOSY
PREFILLED_SYRINGE | INTRAVENOUS | Status: DC | PRN
  Administered 2022-04-02: 5 mg via INTRAVENOUS

## 2022-04-02 MED ORDER — KETOROLAC TROMETHAMINE 30 MG/ML IJ SOLN
INTRAMUSCULAR | Status: AC
  Filled 2022-04-02: qty 1

## 2022-04-02 MED ORDER — IBUPROFEN 600 MG PO TABS: 600.0000 mg | ORAL_TABLET | Freq: Four times a day (QID) | ORAL | Status: DC

## 2022-04-02 MED ORDER — PHENYLEPHRINE 80 MCG/ML (10ML) SYRINGE FOR IV PUSH (FOR BLOOD PRESSURE SUPPORT)
PREFILLED_SYRINGE | INTRAVENOUS | Status: AC
  Filled 2022-04-02: qty 10

## 2022-04-02 MED ORDER — PHENYLEPHRINE HCL-NACL 20-0.9 MG/250ML-% IV SOLN
INTRAVENOUS | Status: AC
  Filled 2022-04-02: qty 250

## 2022-04-02 MED ORDER — PROMETHAZINE HCL 25 MG/ML IJ SOLN: 6.2500 mg | INTRAMUSCULAR | Status: DC | PRN

## 2022-04-02 MED ORDER — KETOROLAC TROMETHAMINE 30 MG/ML IJ SOLN
30.0000 mg | Freq: Once | INTRAMUSCULAR | Status: AC | PRN
  Administered 2022-04-02: 30 mg via INTRAVENOUS

## 2022-04-02 MED ORDER — SODIUM CHLORIDE 0.9 % IR SOLN
Status: DC | PRN
  Administered 2022-04-02: 1

## 2022-04-02 MED ORDER — DEXAMETHASONE SODIUM PHOSPHATE 10 MG/ML IJ SOLN
INTRAMUSCULAR | Status: AC
  Filled 2022-04-02: qty 1

## 2022-04-02 MED ORDER — HYDROMORPHONE HCL 1 MG/ML IJ SOLN
0.2500 mg | INTRAMUSCULAR | Status: DC | PRN
  Administered 2022-04-02 (×2): 0.25 mg via INTRAVENOUS

## 2022-04-02 MED ORDER — MAGNESIUM HYDROXIDE 400 MG/5ML PO SUSP: 30.0000 mL | ORAL | Status: DC | PRN

## 2022-04-02 MED ORDER — PRENATAL MULTIVITAMIN CH
1.0000 | ORAL_TABLET | Freq: Every day | ORAL | Status: DC
Start: 2022-04-03 — End: 2022-04-04
  Administered 2022-04-03: 1 via ORAL
  Filled 2022-04-02: qty 1

## 2022-04-02 MED ORDER — DIBUCAINE (PERIANAL) 1 % EX OINT: 1.0000 | TOPICAL_OINTMENT | CUTANEOUS | Status: DC | PRN

## 2022-04-02 MED ORDER — NALOXONE HCL 0.4 MG/ML IJ SOLN: 0.4000 mg | INTRAMUSCULAR | Status: DC | PRN

## 2022-04-02 MED ORDER — LIDOCAINE-EPINEPHRINE (PF) 2 %-1:200000 IJ SOLN
INTRAMUSCULAR | Status: AC
  Filled 2022-04-02: qty 20

## 2022-04-02 MED ORDER — DIPHENHYDRAMINE HCL 25 MG PO CAPS: 25.0000 mg | ORAL_CAPSULE | Freq: Four times a day (QID) | ORAL | Status: DC | PRN

## 2022-04-02 MED ORDER — OXYCODONE-ACETAMINOPHEN 5-325 MG PO TABS: 2.0000 | ORAL_TABLET | ORAL | Status: DC | PRN

## 2022-04-02 MED ORDER — MENTHOL 3 MG MT LOZG: 1.0000 | LOZENGE | OROMUCOSAL | Status: DC | PRN

## 2022-04-02 MED ORDER — MORPHINE SULFATE (PF) 0.5 MG/ML IJ SOLN
INTRAMUSCULAR | Status: AC
  Filled 2022-04-02: qty 10

## 2022-04-02 MED ORDER — COCONUT OIL OIL: 1.0000 | TOPICAL_OIL | Status: DC | PRN

## 2022-04-02 MED ORDER — CEFAZOLIN SODIUM-DEXTROSE 2-4 GM/100ML-% IV SOLN
INTRAVENOUS | Status: AC
  Filled 2022-04-02: qty 100

## 2022-04-02 MED ORDER — TETANUS-DIPHTH-ACELL PERTUSSIS 5-2.5-18.5 LF-MCG/0.5 IM SUSY
0.5000 mL | PREFILLED_SYRINGE | Freq: Once | INTRAMUSCULAR | Status: AC
  Administered 2022-04-04: 0.5 mL via INTRAMUSCULAR
  Filled 2022-04-02: qty 0.5

## 2022-04-02 MED ORDER — DIPHENHYDRAMINE HCL 50 MG/ML IJ SOLN: 12.5000 mg | INTRAMUSCULAR | Status: DC | PRN

## 2022-04-02 MED ORDER — FENTANYL CITRATE (PF) 100 MCG/2ML IJ SOLN
INTRAMUSCULAR | Status: AC
  Filled 2022-04-02: qty 2

## 2022-04-02 MED ORDER — FERROUS SULFATE 325 (65 FE) MG PO TABS
325.0000 mg | ORAL_TABLET | ORAL | Status: DC
  Administered 2022-04-03: 325 mg via ORAL
  Filled 2022-04-02: qty 1

## 2022-04-02 MED ORDER — GABAPENTIN 300 MG PO CAPS
300.0000 mg | ORAL_CAPSULE | Freq: Two times a day (BID) | ORAL | Status: DC
  Administered 2022-04-02 – 2022-04-04 (×4): 300 mg via ORAL
  Filled 2022-04-02 (×4): qty 1

## 2022-04-02 MED ORDER — SENNOSIDES-DOCUSATE SODIUM 8.6-50 MG PO TABS
2.0000 | ORAL_TABLET | Freq: Every day | ORAL | Status: DC
  Administered 2022-04-03 – 2022-04-04 (×2): 2 via ORAL
  Filled 2022-04-02 (×2): qty 2

## 2022-04-02 MED ORDER — STERILE WATER FOR IRRIGATION IR SOLN
Status: DC | PRN
  Administered 2022-04-02: 1

## 2022-04-02 MED ORDER — POVIDONE-IODINE 10 % EX SWAB
2.0000 | Freq: Once | CUTANEOUS | Status: AC
  Administered 2022-04-02: 2 via TOPICAL

## 2022-04-02 MED ORDER — TRANEXAMIC ACID-NACL 1000-0.7 MG/100ML-% IV SOLN
INTRAVENOUS | Status: AC
  Filled 2022-04-02: qty 100

## 2022-04-02 MED ORDER — WITCH HAZEL-GLYCERIN EX PADS: 1.0000 | MEDICATED_PAD | CUTANEOUS | Status: DC | PRN

## 2022-04-02 MED ORDER — OXYCODONE HCL 5 MG PO TABS: 5.0000 mg | ORAL_TABLET | Freq: Once | ORAL | Status: DC | PRN

## 2022-04-02 MED ORDER — OXYCODONE HCL 5 MG PO TABS
5.0000 mg | ORAL_TABLET | ORAL | Status: DC | PRN
  Administered 2022-04-03: 5 mg via ORAL
  Administered 2022-04-03 – 2022-04-04 (×5): 10 mg via ORAL
  Filled 2022-04-02 (×2): qty 2
  Filled 2022-04-02: qty 1
  Filled 2022-04-02 (×3): qty 2

## 2022-04-02 MED ORDER — DEXAMETHASONE SODIUM PHOSPHATE 4 MG/ML IJ SOLN
INTRAMUSCULAR | Status: AC
  Filled 2022-04-02: qty 1

## 2022-04-02 MED ORDER — KETOROLAC TROMETHAMINE 30 MG/ML IJ SOLN
30.0000 mg | Freq: Four times a day (QID) | INTRAMUSCULAR | Status: DC
  Administered 2022-04-03: 30 mg via INTRAVENOUS
  Filled 2022-04-02 (×3): qty 1

## 2022-04-02 MED ORDER — CEFAZOLIN SODIUM-DEXTROSE 2-4 GM/100ML-% IV SOLN
2.0000 g | INTRAVENOUS | Status: AC
  Administered 2022-04-02: 2 g via INTRAVENOUS

## 2022-04-02 MED ORDER — HYDROMORPHONE HCL 1 MG/ML IJ SOLN
INTRAMUSCULAR | Status: AC
  Filled 2022-04-02: qty 0.5

## 2022-04-02 MED ORDER — PHENYLEPHRINE HCL-NACL 20-0.9 MG/250ML-% IV SOLN
INTRAVENOUS | Status: DC | PRN
  Administered 2022-04-02: 40 ug/min via INTRAVENOUS

## 2022-04-02 MED ORDER — FENTANYL CITRATE (PF) 100 MCG/2ML IJ SOLN
INTRAMUSCULAR | Status: DC | PRN
  Administered 2022-04-02: 15 ug via INTRATHECAL

## 2022-04-02 MED ORDER — SIMETHICONE 80 MG PO CHEW
80.0000 mg | CHEWABLE_TABLET | ORAL | Status: DC | PRN
  Administered 2022-04-03 – 2022-04-04 (×2): 80 mg via ORAL
  Filled 2022-04-02 (×2): qty 1

## 2022-04-02 MED ORDER — SODIUM CHLORIDE 0.9% FLUSH: 3.0000 mL | INTRAVENOUS | Status: DC | PRN

## 2022-04-02 MED ORDER — OXYTOCIN-SODIUM CHLORIDE 30-0.9 UT/500ML-% IV SOLN
INTRAVENOUS | Status: AC
  Filled 2022-04-02: qty 500

## 2022-04-02 SURGICAL SUPPLY — 36 items
ADH SKN CLS APL DERMABOND .7 (GAUZE/BANDAGES/DRESSINGS) ×1
CHLORAPREP W/TINT 26ML (MISCELLANEOUS) ×4 IMPLANT
CLAMP CORD UMBIL (MISCELLANEOUS) ×2 IMPLANT
CLIP FILSHIE TUBAL LIGA STRL (Clip) IMPLANT
CLOTH BEACON ORANGE TIMEOUT ST (SAFETY) ×2 IMPLANT
DERMABOND ADVANCED (GAUZE/BANDAGES/DRESSINGS) ×1
DERMABOND ADVANCED .7 DNX12 (GAUZE/BANDAGES/DRESSINGS) IMPLANT
DRSG OPSITE POSTOP 4X10 (GAUZE/BANDAGES/DRESSINGS) ×2 IMPLANT
ELECT REM PT RETURN 9FT ADLT (ELECTROSURGICAL) ×2
ELECTRODE REM PT RTRN 9FT ADLT (ELECTROSURGICAL) ×1 IMPLANT
EXTRACTOR VACUUM M CUP 4 TUBE (SUCTIONS) IMPLANT
GAUZE SPONGE 4X4 12PLY STRL LF (GAUZE/BANDAGES/DRESSINGS) ×2 IMPLANT
GLOVE BIOGEL PI IND STRL 7.0 (GLOVE) ×3 IMPLANT
GLOVE BIOGEL PI INDICATOR 7.0 (GLOVE) ×3
GLOVE ECLIPSE 7.0 STRL STRAW (GLOVE) ×2 IMPLANT
GOWN STRL REUS W/TWL LRG LVL3 (GOWN DISPOSABLE) ×4 IMPLANT
HEMOSTAT SURGICEL 4X8 (HEMOSTASIS) ×1 IMPLANT
KIT ABG SYR 3ML LUER SLIP (SYRINGE) IMPLANT
NDL HYPO 25X5/8 SAFETYGLIDE (NEEDLE) ×1 IMPLANT
NEEDLE HYPO 22GX1.5 SAFETY (NEEDLE) ×2 IMPLANT
NEEDLE HYPO 25X5/8 SAFETYGLIDE (NEEDLE) ×2 IMPLANT
NS IRRIG 1000ML POUR BTL (IV SOLUTION) ×2 IMPLANT
PACK C SECTION WH (CUSTOM PROCEDURE TRAY) ×2 IMPLANT
PAD ABD 7.5X8 STRL (GAUZE/BANDAGES/DRESSINGS) ×2 IMPLANT
PAD OB MATERNITY 4.3X12.25 (PERSONAL CARE ITEMS) ×2 IMPLANT
RTRCTR C-SECT PINK 25CM LRG (MISCELLANEOUS) IMPLANT
SUT PDS AB 0 CTX 36 PDP370T (SUTURE) IMPLANT
SUT PLAIN 2 0 XLH (SUTURE) IMPLANT
SUT VIC AB 0 CTX 36 (SUTURE) ×4
SUT VIC AB 0 CTX36XBRD ANBCTRL (SUTURE) ×2 IMPLANT
SUT VIC AB 4-0 KS 27 (SUTURE) ×2 IMPLANT
SYR CONTROL 10ML LL (SYRINGE) ×2 IMPLANT
TAPE CLOTH SURG 4X10 WHT LF (GAUZE/BANDAGES/DRESSINGS) ×1 IMPLANT
TOWEL OR 17X24 6PK STRL BLUE (TOWEL DISPOSABLE) ×2 IMPLANT
TRAY FOLEY W/BAG SLVR 14FR LF (SET/KITS/TRAYS/PACK) ×2 IMPLANT
WATER STERILE IRR 1000ML POUR (IV SOLUTION) ×2 IMPLANT

## 2022-04-02 NOTE — Anesthesia Postprocedure Evaluation (Signed)
Anesthesia Post Note  Patient: Colleen Patterson  Procedure(s) Performed: CESAREAN SECTION     Patient location during evaluation: PACU Anesthesia Type: Spinal Level of consciousness: awake and alert Pain management: pain level controlled Vital Signs Assessment: post-procedure vital signs reviewed and stable Respiratory status: spontaneous breathing, nonlabored ventilation and respiratory function stable Cardiovascular status: blood pressure returned to baseline and stable Postop Assessment: no apparent nausea or vomiting Anesthetic complications: no   No notable events documented.  Last Vitals:  Vitals:   04/02/22 1627 04/02/22 1630  BP: 108/65 108/65  Pulse: 74 74  Resp:  17  Temp:  36.6 C  SpO2: 99% 99%    Last Pain:  Vitals:   04/02/22 1630  TempSrc: Axillary  PainSc: 4    Pain Goal: Patients Stated Pain Goal: 1 (04/02/22 1502)                 Lowella Curb

## 2022-04-02 NOTE — Anesthesia Procedure Notes (Signed)
Spinal  Patient location during procedure: OB Start time: 04/02/2022 12:52 PM End time: 04/02/2022 12:57 PM Reason for block: surgical anesthesia Staffing Performed: other anesthesia staff  Anesthesiologist: Lowella Curb, MD Performed by: Lowella Curb, MD Authorized by: Lowella Curb, MD   Preanesthetic Checklist Completed: patient identified, IV checked, risks and benefits discussed, surgical consent, monitors and equipment checked, pre-op evaluation and timeout performed Spinal Block Patient position: sitting Prep: DuraPrep and site prepped and draped Patient monitoring: heart rate, cardiac monitor, continuous pulse ox and blood pressure Approach: midline Location: L3-4 Injection technique: single-shot Needle Needle type: Pencan  Needle gauge: 24 G Needle length: 10 cm Assessment Sensory level: T4 Events: CSF return Additional Notes SAB placed by SRNA under direct supervision

## 2022-04-02 NOTE — Anesthesia Preprocedure Evaluation (Signed)
Anesthesia Evaluation  Patient identified by MRN, date of birth, ID band Patient awake    Reviewed: Allergy & Precautions, NPO status , Patient's Chart, lab work & pertinent test results  Airway Mallampati: II  TM Distance: >3 FB Neck ROM: Full    Dental no notable dental hx.    Pulmonary neg pulmonary ROS,    Pulmonary exam normal breath sounds clear to auscultation       Cardiovascular negative cardio ROS Normal cardiovascular exam Rhythm:Regular Rate:Normal     Neuro/Psych Depression negative neurological ROS  negative psych ROS   GI/Hepatic negative GI ROS, Neg liver ROS,   Endo/Other  negative endocrine ROSdiabetes  Renal/GU negative Renal ROS  negative genitourinary   Musculoskeletal negative musculoskeletal ROS (+)   Abdominal (+) + obese,   Peds negative pediatric ROS (+)  Hematology negative hematology ROS (+)   Anesthesia Other Findings   Reproductive/Obstetrics negative OB ROS                             Anesthesia Physical Anesthesia Plan  ASA: 2  Anesthesia Plan: Spinal   Post-op Pain Management:    Induction:   PONV Risk Score and Plan: Treatment may vary due to age or medical condition  Airway Management Planned: Natural Airway  Additional Equipment:   Intra-op Plan:   Post-operative Plan:   Informed Consent: I have reviewed the patients History and Physical, chart, labs and discussed the procedure including the risks, benefits and alternatives for the proposed anesthesia with the patient or authorized representative who has indicated his/her understanding and acceptance.     Dental advisory given  Plan Discussed with: CRNA  Anesthesia Plan Comments:         Anesthesia Quick Evaluation

## 2022-04-02 NOTE — Op Note (Addendum)
Colleen Patterson PROCEDURE DATE: 04/02/2022  PREOPERATIVE DIAGNOSES: Intrauterine pregnancy at [redacted]w[redacted]d weeks gestation; diet controlled gestational diabetes;  previous cesarean section x 2; desires intrauterine device for contraception  POSTOPERATIVE DIAGNOSES: The same  PROCEDURE: Repeat Low Transverse Cesarean Section and Paragard Intrauterine Device Placement  SURGEON:  Dr. Jaynie Collins  ASSISTANT:  Dr. Sheppard Evens. An experienced assistant was required given the standard of surgical care given the complexity of the case.  This assistant was needed for exposure, dissection, suctioning, retraction, instrument exchange, assisting with delivery with administration of fundal pressure, and for overall help during the procedure.  ANESTHESIOLOGY TEAM: Anesthesiologist: Lowella Curb, MD CRNA: Wilder Glade, CRNA  INDICATIONS: Colleen Patterson is a 36 y.o. [phone] at [redacted]w[redacted]d here for cesarean section secondary to the indications listed under preoperative diagnoses; please see preoperative note for further details.  The risks of surgery were discussed with the patient including but were not limited to: bleeding which may require transfusion or reoperation; infection which may require antibiotics; injury to bowel, bladder, ureters or other surrounding organs; injury to the fetus; need for additional procedures including hysterectomy in the event of a life-threatening hemorrhage; formation of adhesions; placental abnormalities wth subsequent pregnancies; incisional problems; thromboembolic phenomenon and other postoperative/anesthesia complications.  The patient concurred with the proposed plan, giving informed written consent for the procedure.    FINDINGS:  Viable female infant in cephalic presentation.  Apgars 9 and 9.  Weight 7 lb 7oz. Clear amniotic fluid.  Intact placenta, three vessel cord.  Normal uterus, fallopian tubes and ovaries bilaterally. There was adhesions of the right and mid anterior  uterus to the anterior abdominal wall, these were lysed bluntly and with also with electrocautery.  ANESTHESIA: Spinal ESTIMATED BLOOD LOSS: 500 ml SPECIMENS: Placenta sent to L&D COMPLICATIONS: None immediate  PROCEDURE IN DETAIL:  The patient preoperatively received intravenous antibiotics and had sequential compression devices applied to her lower extremities.  She was then taken to the operating room where spinal anesthesia was administered and was found to be adequate. She was then placed in a dorsal supine position with a leftward tilt, and prepped and draped in a sterile manner.  A foley catheter was placed into her bladder and attached to constant gravity.  After an adequate timeout was performed, a Pfannenstiel skin incision was made with scalpel on her preexisting scar and carried through to the underlying layer of fascia. The fascia was incised in the midline, and this incision was extended bilaterally using the Mayo scissors.  Kocher clamps were applied to the superior aspect of the fascial incision and the underlying rectus muscles were dissected off bluntly and sharply.  A similar process was carried out on the inferior aspect of the fascial incision. The rectus muscles were separated in the midline and the peritoneum was entered bluntly. The Alexis self-retaining retractor was introduced into the abdominal cavity.  Attention was turned to the lower uterine segment where a low transverse hysterotomy was made with a scalpel and extended bilaterally bluntly.  The infant was successfully delivered, the cord was clamped and cut after one minute, and the infant was handed over to the awaiting neonatology team. Uterine massage was then administered, and the placenta delivered intact with a three-vessel cord. The uterus was then cleared of clots and debris.  The Paragard IUD was placed in the fundal region, and the strings were pushed through the lower uterine segment into cervix. The hysterotomy was  closed with 0 Vicryl in a running locked fashion,  and an imbricating layer was also placed with 0 Vicryl.  Figure-of-eight 0 Vicryl serosal stitches were placed to help with hemostasis.  The pelvis was cleared of all clot and debris. Hemostasis was confirmed on all surfaces. Surgicel was placed over the hysterotomy.   The retractor was removed.  The peritoneum was closed with a 0 Vicryl running stitch. The fascia was then closed using 0 PDS in a running fashion.  The subcutaneous layer was irrigated, reapproximated with 2-0 plain gut interrupted stitches, and the skin was closed with a 4-0 Vicryl subcuticular stitch. The patient tolerated the procedure well. Sponge, instrument and needle counts were correct x 3.  She was taken to the recovery room in stable condition.    Jaynie Collins, MD, FACOG Obstetrician & Gynecologist, [facility] for [employer], [facility]
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
## 2022-04-02 NOTE — Transfer of Care (Signed)
Immediate Anesthesia Transfer of Care Note  Patient: Colleen Patterson  Procedure(s) Performed: CESAREAN SECTION  Patient Location: PACU  Anesthesia Type:Spinal  Level of Consciousness: awake, alert , oriented and patient cooperative  Airway & Oxygen Therapy: Patient Spontanous Breathing  Post-op Assessment: Report given to RN and Post -op Vital signs reviewed and stable  Post vital signs: Reviewed and stable  Last Vitals:  Vitals Value Taken Time  BP 93/80 04/02/22 1446  Temp    Pulse 25 04/02/22 1446  Resp 19 04/02/22 1449  SpO2 86 % 04/02/22 1446  Vitals shown include unvalidated device data.  Last Pain:  Vitals:   04/02/22 0816  PainSc: 0-No pain         Complications: No notable events documented.

## 2022-04-02 NOTE — Discharge Summary (Signed)
Postpartum Discharge Summary  Date of Service updated yes    Patient Name: Colleen Patterson DOB: 1986-03-10 MRN: [id number]  Date of admission: 04/02/2022 Delivery date:04/02/2022  Delivering provider: Verita Schneiders A  Date of discharge: 04/04/2022  Admitting diagnosis: Previous cesarean section [Z98.891] Intrauterine pregnancy: [redacted]w[redacted]d    Secondary diagnosis:  Principal Problem:   S/P cesarean section Active Problems:   Supervision of high risk pregnancy, antepartum   History of 2 cesarean sections   Diet controlled gestational diabetes mellitus (GDM) in third trimester   Fetal abnormality in pregnancy   GBS (group B Streptococcus carrier), +RV culture, currently pregnant   Paragard IUD (intrauterine device) in place  Additional problems: Prior C/S X2    -A1 GDM                                      -GBS+                                     -Pyelectasis of fetus on prenatal ultrasound                                       -Unilateral UTD (Right) 12 mm   Discharge diagnosis: Term Pregnancy Delivered and GDM A1                                              Post partum procedures: none Augmentation: N/A Complications: None  Hospital course: Sceduled C/S   36y.o. yo [id number] admitted to the hospital 04/02/2022 for scheduled cesarean section with the following indication:Elective Repeat.Delivery details are as follows:  Membrane Rupture Time/Date: 1:31 PM ,04/02/2022   Delivery Method:C-Section, Vacuum Assisted  Details of operation can be found in separate operative note.  Patient had an uncomplicated postpartum course.  She is ambulating, tolerating a regular diet, passing flatus, and urinating well. Patient is discharged home in stable condition on  04/04/22        Newborn Data: Birth date:04/02/2022  Birth time:1:32 PM  Gender:Female  Living status:Living  Apgars:9 ,9  Weight:3380 g     Magnesium Sulfate received: No BMZ received: No Rhophylac:N/A MMR:N/A T-DaP:Given  postpartum Flu: N/A Transfusion:No  Physical exam  Vitals:   04/03/22 0720 04/03/22 1415 04/03/22 2111 04/04/22 0606  BP: 100/62 (!) 102/52 110/63 101/67  Pulse: 82 94 95 85  Resp:   17 17  Temp:   97.7 F (36.5 C) 97.9 F (36.6 C)  TempSrc:   Oral Oral  SpO2: 98%  99% 98%  Weight:      Height:       General: alert, cooperative, and no distress Lochia: appropriate Uterine Fundus: firm Incision: Dressing is clean, dry, and intact DVT Evaluation: No evidence of DVT seen on physical exam. Labs: Lab Results  Component Value Date   WBC 13.3 (H) 04/03/2022   HGB 11.7 (L) 04/03/2022   HCT 34.9 (L) 04/03/2022   MCV 87.3 04/03/2022   PLT 181 04/03/2022      Latest Ref Rng & Units 05/02/2021    2:20 PM  CMP  Glucose 70 -  99 mg/dL 100   BUN 6 - 20 mg/dL 8   Creatinine 0.44 - 1.00 mg/dL 0.65   Sodium 135 - 145 mmol/L 140   Potassium 3.5 - 5.1 mmol/L 3.8   Chloride 98 - 111 mmol/L 105   CO2 22 - 32 mmol/L 26   Calcium 8.9 - 10.3 mg/dL 9.9   Total Protein 6.5 - 8.1 g/dL 8.2   Total Bilirubin 0.3 - 1.2 mg/dL 0.5   Alkaline Phos 38 - 126 U/L 65   AST 15 - 41 U/L 14   ALT 0 - 44 U/L 31    Edinburgh Score:    04/02/2022    6:00 PM  Edinburgh Postnatal Depression Scale Screening Tool  I have been able to laugh and see the funny side of things. 0  I have looked forward with enjoyment to things. 0  I have blamed myself unnecessarily when things went wrong. 0  I have been anxious or worried for no good reason. 0  I have felt scared or panicky for no good reason. 0  Things have been getting on top of me. 0  I have been so unhappy that I have had difficulty sleeping. 0  I have felt sad or miserable. 0  I have been so unhappy that I have been crying. 0  The thought of harming myself has occurred to me. 0  Edinburgh Postnatal Depression Scale Total 0     After visit meds:  Allergies as of 04/04/2022   No Known Allergies      Medication List     STOP taking these  medications    Accu-Chek Guide Me w/Device Kit   Accu-Chek Guide test strip Generic drug: glucose blood   Accu-Chek Softclix Lancets lancets   Blood Pressure Kit Devi   Comfort Fit Maternity Supp Sm Misc   Gojji Weight Scale Misc       TAKE these medications    ferrous sulfate 325 (65 FE) MG tablet Take 1 tablet (325 mg total) by mouth every other day. Take with a fruit, avoid within 1 hr of dairy intake Start taking on: April 05, 2022   ibuprofen 600 MG tablet Commonly known as: ADVIL Take 1 tablet (600 mg total) by mouth every 6 (six) hours.   oxyCODONE 5 MG immediate release tablet Commonly known as: Oxy IR/ROXICODONE Take 1 tablet (5 mg total) by mouth every 6 (six) hours as needed for moderate pain.   polyethylene glycol powder 17 GM/SCOOP powder Commonly known as: GLYCOLAX/MIRALAX Take 17 g by mouth daily.   PrePLUS 27-1 MG Tabs Take 1 tablet by mouth daily.        Discharge home in stable condition Infant Feeding: Bottle and Breast Infant Disposition:home with mother Discharge instruction: per After Visit Summary and Postpartum booklet. Activity: Advance as tolerated. Pelvic rest for 6 weeks.  Diet: carb modified diet Future Appointments: Future Appointments  Date Time Provider [place]  04/11/2022  9:00 AM [place] None  05/19/2022  3:50 PM Gavin Pound, CNM [facility] None   Follow up Visit:  Message sent to [facility] clinic by Mikki Santee, MD Please schedule this patient for a In person postpartum visit in 6 weeks with the following provider: Any provider. Additional Postpartum F/U:2 hour GTT and Incision check in 1 week   High risk pregnancy complicated by: GDM and 2 previous C-sections Delivery mode:  C-Section, Vacuum Assisted  Anticipated Birth Control:  PP IUD placed - paragard IUD in place  Liliane Channel MD MPH OB Fellow, Faculty Practice

## 2022-04-03 ENCOUNTER — Encounter (HOSPITAL_COMMUNITY): Payer: Self-pay | Admitting: Obstetrics & Gynecology

## 2022-04-03 LAB — CBC
HCT: 34.9 % — ABNORMAL LOW (ref 36.0–46.0)
Hemoglobin: 11.7 g/dL — ABNORMAL LOW (ref 12.0–15.0)
MCH: 29.3 pg (ref 26.0–34.0)
MCHC: 33.5 g/dL (ref 30.0–36.0)
MCV: 87.3 fL (ref 80.0–100.0)
Platelets: 181 10*3/uL (ref 150–400)
RBC: 4 MIL/uL (ref 3.87–5.11)
RDW: 14.1 % (ref 11.5–15.5)
WBC: 13.3 10*3/uL — ABNORMAL HIGH (ref 4.0–10.5)
nRBC: 0 % (ref 0.0–0.2)

## 2022-04-03 LAB — GLUCOSE, CAPILLARY: Glucose-Capillary: 82 mg/dL (ref 70–99)

## 2022-04-03 MED ORDER — IBUPROFEN 600 MG PO TABS
600.0000 mg | ORAL_TABLET | Freq: Four times a day (QID) | ORAL | Status: DC
  Administered 2022-04-03 – 2022-04-04 (×5): 600 mg via ORAL
  Filled 2022-04-03 (×5): qty 1

## 2022-04-03 NOTE — Progress Notes (Signed)
CSW received consult for hx of Depression.  CSW met with MOB to offer support and complete assessment. When CSW entered room, FOB was present. MOB provided verbal consent to speak in front of FOB about anything. FOB was observed sitting on couch, MOB was holding and bonding with infant on hospital bed. CSW introduced self and reason for consult. MOB presented as warm and remained engaged throughout consult.   CSW inquired about MOB's mental health history. MOB shares she had 2 miscarriages prior to becoming pregnant with infant which she did experience feelings of sadness after. MOB shared she felt anxiety during the beginning of her pregnancy due to previous miscarriages but reports her feelings of anxiety improved as her pregnancy progressed. CSW validated and normalized MOB's emotions and offered condolences. MOB denies experiencing other feelings of depression or mental health symptoms. MOB denied experiencing the baby blues or postpartum depression after giving birth to her two daughters. MOB denied current SI/HI. DV was not assessed due to FOB being present.   MOB reports she has needed items for infant including a car seat and bassinet but could use help with diapers and formula. MOB reports she receives [facility]. CSW encouraged MOB to use WIC benefits for formula and encouraged MOB to contact [facility] on Monday, 04/04/22 to have infant added to benefits. MOB shared she moved from [place] 1 year ago. CSW inquired about interest in a referral to a home visiting program for additional support and resources. MOB expressed interest and provided verbal consent for CSW to place a referral to [facility]. MOB inquired about applying to Medicaid for infant. CSW explained that infant should be added to Medicaid benefits due to MOB being insured through [facility] but offered to have [employer] Counselor contact MOB for reassurance. MOB verbally consented to speaking with a Champ regarding infant's  Medicaid application and thanked CSW for assistance. MOB has chosen [facility] as infant's pediatrician office.  CSW provided education regarding the baby blues period vs. perinatal mood disorders, discussed treatment and gave resources for mental health follow up if concerns arise.  CSW recommends self-evaluation during the postpartum time period using the New Mom Checklist from Postpartum Progress and encouraged MOB to contact a medical professional if symptoms are noted at any time.    CSW provided review of Sudden Infant Death Syndrome (SIDS) precautions.    CSW identifies no further need for intervention and no barriers to discharge at this time.  Signed,  Berniece Salines, MSW, LCSWA, LCASA 04/03/2022 12:28 PM

## 2022-04-03 NOTE — Progress Notes (Signed)
Subjective: Postpartum Day #1: Cesarean Delivery (rLTCS & postplacental IUD) Patient reports incisional pain, tolerating PO, + flatus, and no problems voiding; received Oxy IR 5mg  approx 2h ago with minimal relief; has voided x 2 and passed gas; bottlefeeding; no dizziness w ambulation; she is considering a circumcision for her son- questions were answered and consent was obtained should she decide to proceed with this elective procedure  Objective: Vital signs in last 24 hours: Temp:  [96.3 F (35.7 C)-98.1 F (36.7 C)] 97.8 F (36.6 C) (08/06 0349) Pulse Rate:  [25-108] 82 (08/06 0720) Resp:  [15-24] 18 (08/06 0349) BP: (90-112)/(56-81) 100/62 (08/06 0720) SpO2:  [86 %-99 %] 98 % (08/06 0720)  Physical Exam:  General: alert, cooperative, and mild distress Lochia: appropriate Uterine Fundus: firm Incision: pressure dsg intact and dry DVT Evaluation: No evidence of DVT seen on physical exam.  Recent Labs    04/03/22 0439  HGB 11.7*  HCT 34.9*   Fasting CBG: 82  Assessment/Plan: Status post Cesarean section. Doing well postoperatively.  Continue current care. Discussed range of Oxy IR 5-10mg  available and recommended requesting 10mg  with the next dose; circumcision consent note placed on son's chart. Anticipate d/c either 8/7 or 8/8.  , CNM 04/03/2022, 10:30 AM

## 2022-04-03 NOTE — Plan of Care (Signed)
  Problem: Education: Goal: Knowledge of General Education information will improve Description: Including pain rating scale, medication(s)/side effects and non-pharmacologic comfort measures Outcome: Completed/Met   Problem: Clinical Measurements: Goal: Ability to maintain clinical measurements within normal limits will improve Outcome: Completed/Met

## 2022-04-04 LAB — GLUCOSE, CAPILLARY: Glucose-Capillary: 82 mg/dL (ref 70–99)

## 2022-04-04 MED ORDER — FERROUS SULFATE 325 (65 FE) MG PO TABS: 325.0000 mg | ORAL_TABLET | ORAL | 0 refills | Status: AC

## 2022-04-04 MED ORDER — OXYCODONE HCL 5 MG PO TABS: 5.0000 mg | ORAL_TABLET | Freq: Four times a day (QID) | ORAL | 0 refills | Status: AC | PRN

## 2022-04-04 MED ORDER — IBUPROFEN 600 MG PO TABS: 600.0000 mg | ORAL_TABLET | Freq: Four times a day (QID) | ORAL | 0 refills | Status: AC

## 2022-04-04 MED ORDER — POLYETHYLENE GLYCOL 3350 17 GM/SCOOP PO POWD: 17.0000 g | Freq: Every day | ORAL | 0 refills | Status: AC

## 2022-04-04 NOTE — Discharge Instructions (Signed)
Take off the honeycomb dressing on 8/10 as specified.  2.  Follow-up outpatient in 4 to 6 weeks, as scheduled for your postpartum visit.  You would have a repeat blood sugar test done at that time.  3.  Take Tylenol 1000 mg and ibuprofen 600 mg scheduled times a day for the next few days, to help with your pain.  You can also take oxycodone as needed for breakthrough pain.  4.  Please call if you start to experience increased vaginal bleeding requiring a change of pads every 2 hours, sudden onset severe headache, right abdominal pain, flashes of light in a vision or any concerns for new elevated blood pressure.

## 2022-04-05 ENCOUNTER — Telehealth: Payer: Self-pay | Admitting: Emergency Medicine

## 2022-04-05 ENCOUNTER — Inpatient Hospital Stay (HOSPITAL_COMMUNITY)
Admission: AD | Admit: 2022-04-05 | Discharge: 2022-04-05 | Disposition: A | Discharge status: Home / Self Care | Payer: Medicaid Other | Attending: Obstetrics & Gynecology | Admitting: Obstetrics & Gynecology

## 2022-04-05 DIAGNOSIS — Z711 Person with feared health complaint in whom no diagnosis is made: Secondary | ICD-10-CM | POA: Diagnosis not present

## 2022-04-05 DIAGNOSIS — M7989 Other specified soft tissue disorders: Secondary | ICD-10-CM | POA: Diagnosis not present

## 2022-04-05 NOTE — MAU Provider Note (Signed)
Event Date/Time   First Provider Initiated Contact with Patient 04/05/22 1316      S Ms. Colleen Patterson is a 36 y.o. [id number] patient who presents to MAU today with complaint of intermittent bilateral foot swelling 4 days PP. Patient was a R/p C/S on Saturday and was discharged home on Sunday. Patient states she noticed swelling this morning. Patient states she went to peds appointment and swelling became worse. She denies waking up to swelling and denies attempting to rest and elevate feet. She denies headache, blurred vision, and epigastric pain. She denies any history of hypertension.    O BP 117/69 (BP Location: Right Arm)   Pulse 79   Temp 98 F (36.7 C) (Oral)   Resp 16   LMP 07/02/2021 (Exact Date)   SpO2 98%   Breastfeeding No  Physical Exam Vitals and nursing note reviewed.  Constitutional:      General: She is not in acute distress.    Appearance: Normal appearance.  HENT:     Head: Normocephalic.  Pulmonary:     Effort: Pulmonary effort is normal.  Musculoskeletal:     Cervical back: Normal range of motion.     Comments: Mild non-pitting bilateral swelling  Skin:    General: Skin is warm and dry.  Neurological:     Mental Status: She is alert and oriented to person, place, and time.     Deep Tendon Reflexes: Reflexes normal.     Comments: No hyper-reflexia.   Psychiatric:        Mood and Affect: Mood normal.   Patient Vitals for the past 24 hrs:  BP Temp Temp src Pulse Resp SpO2  04/05/22 1315 117/69 -- -- 79 -- --  04/05/22 1312 112/71 98 F (36.7 C) Oral 88 16 98 %    A Medical screening exam complete - Normal postpartum swelling.  P 1. Physically well but worried   2. Postpartum care and examination   - Recommended to go home and elevate feet. Discussed the normal fluid shifts that occur in postpartum and swelling that may occur with being up doing daily chores around the house.  - Also encouraged compression stockings throughout the day.  -  Reccommended to increase water intake and decrease salt intake.   - Warning signs and symptoms with when to return precautions discussed in detail.  - Patient Discharged home from MAU in stable condition and Patient may return to MAU as needed.   Carlynn Herald, CNM 04/05/2022 1:54 PM

## 2022-04-05 NOTE — Telephone Encounter (Signed)
TC to patient in response to concerns about swelling in legs and feet.  Reports just leaving hospital and has no other questions at this time.

## 2022-04-05 NOTE — MAU Note (Addendum)
Colleen Patterson is a 36 y.o. here in MAU reporting: repeat c/s on Sat, was discharged on Sunday.  Noted this morning, her feet were swollen.  Checked her BP, it was normal.  Took the baby to the pediatrician, her feet seemed worse.  Has not had feet elevated. Called office, was told to be seen within 24 hrs.  No hx of BP problems. HA "not really, but hasn't eaten", denies visual changes or epigastric pain.  Bleeding is ok.  Baby is doing well.  Pt is bottle feeding.  Not hyper reflexic, no clonus.   Onset of complaint: this morning Pain score: little in lower abd- pain medication covers it.  Vitals:   04/05/22 1312 04/05/22 1315  BP: 112/71 117/69  Pulse: 88 79  Resp: 16   Temp: 98 F (36.7 C)   SpO2: 98%       Lab orders placed from triage:  none  Urinating ok, passing gas, no  BM yet.

## 2022-04-08 ENCOUNTER — Encounter: Payer: Medicaid Other | Admitting: Obstetrics and Gynecology

## 2022-04-11 ENCOUNTER — Ambulatory Visit: Payer: Medicaid Other

## 2022-04-12 ENCOUNTER — Telehealth (HOSPITAL_COMMUNITY): Payer: Self-pay | Admitting: *Deleted

## 2022-04-12 NOTE — Telephone Encounter (Signed)
Mom reports feeling good. Incision healing well per mom. No concerns regarding herself at this time. EPDS=0 (hospital score=0) Mom reports baby is well. Feeding, peeing, and pooping without difficulty. Reviewed safe sleep. Mom has no concerns about baby at present.  Duffy Rhody, RN 04-12-2022 at 4:28pm

## 2022-04-13 ENCOUNTER — Ambulatory Visit (INDEPENDENT_AMBULATORY_CARE_PROVIDER_SITE_OTHER): Payer: Medicaid Other

## 2022-04-13 ENCOUNTER — Other Ambulatory Visit: Payer: Medicaid Other

## 2022-04-13 VITALS — BP 106/71 | HR 60 | Ht 65.5 in | Wt 195.0 lb

## 2022-04-13 DIAGNOSIS — O24410 Gestational diabetes mellitus in pregnancy, diet controlled: Secondary | ICD-10-CM

## 2022-04-13 DIAGNOSIS — Z5189 Encounter for other specified aftercare: Secondary | ICD-10-CM

## 2022-04-13 NOTE — Progress Notes (Signed)
Subjective:     Colleen Patterson is a 36 y.o. female who presents to the clinic 1 weeks status post  LTCS  for  incision check . Eating a regular diet without difficulty. Bowel movements are normal. The patient is not having any pain.   Review of Systems A comprehensive review of systems was negative.    Objective:    LMP 07/02/2021 (Exact Date)  General:  alert, cooperative, and no distress  Abdomen: soft, bowel sounds active, non-tender  Incision:   healing well, no drainage, no erythema, no hernia, no seroma, no swelling, no dehiscence, incision well approximated     Assessment:    Doing well postoperatively. No complaints at this time.   Plan:    1. Continue any current medications. 2. Wound care discussed. 3. Activity restrictions:  Patient to do activities with caution and no over-exert  herself. 4. Anticipated return to work: not applicable. 5. Follow up: 6 weeks for PP visit and 2hr glucose test.   Bayard Beaver, LPN

## 2022-04-13 NOTE — Progress Notes (Signed)
Agree with nurses's documentation of this patient's clinic encounter.  Navie Lamoreaux L, MD  

## 2022-04-25 ENCOUNTER — Emergency Department (HOSPITAL_COMMUNITY): Payer: Medicaid Other

## 2022-04-25 ENCOUNTER — Emergency Department (HOSPITAL_COMMUNITY)
Admission: EM | Admit: 2022-04-25 | Discharge: 2022-04-25 | Disposition: A | Discharge status: Home / Self Care | Payer: Medicaid Other | Attending: Emergency Medicine | Admitting: Emergency Medicine

## 2022-04-25 ENCOUNTER — Encounter (HOSPITAL_COMMUNITY): Payer: Self-pay

## 2022-04-25 ENCOUNTER — Other Ambulatory Visit: Payer: Self-pay

## 2022-04-25 DIAGNOSIS — M545 Low back pain, unspecified: Secondary | ICD-10-CM | POA: Diagnosis present

## 2022-04-25 DIAGNOSIS — R109 Unspecified abdominal pain: Secondary | ICD-10-CM | POA: Diagnosis not present

## 2022-04-25 DIAGNOSIS — N3001 Acute cystitis with hematuria: Secondary | ICD-10-CM

## 2022-04-25 LAB — CBC WITH DIFFERENTIAL/PLATELET
Abs Immature Granulocytes: 0.03 10*3/uL (ref 0.00–0.07)
Basophils Absolute: 0 10*3/uL (ref 0.0–0.1)
Basophils Relative: 0 %
Eosinophils Absolute: 0.1 10*3/uL (ref 0.0–0.5)
Eosinophils Relative: 1 %
HCT: 42.1 % (ref 36.0–46.0)
Hemoglobin: 13.5 g/dL (ref 12.0–15.0)
Immature Granulocytes: 0 %
Lymphocytes Relative: 11 %
Lymphs Abs: 1.2 10*3/uL (ref 0.7–4.0)
MCH: 28.7 pg (ref 26.0–34.0)
MCHC: 32.1 g/dL (ref 30.0–36.0)
MCV: 89.4 fL (ref 80.0–100.0)
Monocytes Absolute: 0.7 10*3/uL (ref 0.1–1.0)
Monocytes Relative: 7 %
Neutro Abs: 9.1 10*3/uL — ABNORMAL HIGH (ref 1.7–7.7)
Neutrophils Relative %: 81 %
Platelets: 215 10*3/uL (ref 150–400)
RBC: 4.71 MIL/uL (ref 3.87–5.11)
RDW: 13.3 % (ref 11.5–15.5)
WBC: 11.3 10*3/uL — ABNORMAL HIGH (ref 4.0–10.5)
nRBC: 0 % (ref 0.0–0.2)

## 2022-04-25 LAB — URINALYSIS, ROUTINE W REFLEX MICROSCOPIC
Bilirubin Urine: NEGATIVE
Glucose, UA: NEGATIVE mg/dL
Ketones, ur: NEGATIVE mg/dL
Nitrite: POSITIVE — AB
Protein, ur: 100 mg/dL — AB
Specific Gravity, Urine: 1.03 (ref 1.005–1.030)
WBC, UA: >50 WBC/hpf — ABNORMAL HIGH (ref 0–5)
pH: 6 (ref 5.0–8.0)

## 2022-04-25 LAB — BASIC METABOLIC PANEL
Anion gap: 7 (ref 5–15)
BUN: 16 mg/dL (ref 6–20)
CO2: 25 mmol/L (ref 22–32)
Calcium: 8.9 mg/dL (ref 8.9–10.3)
Chloride: 108 mmol/L (ref 98–111)
Creatinine, Ser: 1.04 mg/dL — ABNORMAL HIGH (ref 0.44–1.00)
GFR, Estimated: >60 mL/min (ref >60)
Glucose, Bld: 104 mg/dL — ABNORMAL HIGH (ref 70–99)
Potassium: 3.7 mmol/L (ref 3.5–5.1)
Sodium: 140 mmol/L (ref 135–145)

## 2022-04-25 LAB — HEPATIC FUNCTION PANEL
ALT: 21 U/L (ref 0–44)
AST: 14 U/L — ABNORMAL LOW (ref 15–41)
Albumin: 3.5 g/dL (ref 3.5–5.0)
Alkaline Phosphatase: 96 U/L (ref 38–126)
Bilirubin, Direct: 0.1 mg/dL (ref 0.0–0.2)
Indirect Bilirubin: 0.2 mg/dL — ABNORMAL LOW (ref 0.3–0.9)
Total Bilirubin: 0.3 mg/dL (ref 0.3–1.2)
Total Protein: 7 g/dL (ref 6.5–8.1)

## 2022-04-25 LAB — LACTIC ACID, PLASMA: Lactic Acid, Venous: 1 mmol/L (ref 0.5–1.9)

## 2022-04-25 LAB — LIPASE, BLOOD: Lipase: 28 U/L (ref 11–51)

## 2022-04-25 MED ORDER — SODIUM CHLORIDE 0.9 % IV SOLN
1.0000 g | INTRAVENOUS | Status: DC
  Administered 2022-04-25: 1 g via INTRAVENOUS
  Filled 2022-04-25: qty 10

## 2022-04-25 MED ORDER — SULFAMETHOXAZOLE-TRIMETHOPRIM 800-160 MG PO TABS: 1.0000 | ORAL_TABLET | Freq: Two times a day (BID) | ORAL | 0 refills | Status: AC

## 2022-04-25 MED ORDER — IOHEXOL 300 MG/ML  SOLN
100.0000 mL | Freq: Once | INTRAMUSCULAR | Status: AC | PRN
  Administered 2022-04-25: 100 mL via INTRAVENOUS

## 2022-04-25 MED ORDER — SODIUM CHLORIDE 0.9 % IV BOLUS
1000.0000 mL | Freq: Once | INTRAVENOUS | Status: AC
  Administered 2022-04-25: 1000 mL via INTRAVENOUS

## 2022-04-25 MED ORDER — ONDANSETRON HCL 4 MG PO TABS: 4.0000 mg | ORAL_TABLET | Freq: Three times a day (TID) | ORAL | 0 refills | Status: AC | PRN

## 2022-04-25 MED ORDER — HYDROMORPHONE HCL 2 MG/ML IJ SOLN
1.0000 mg | Freq: Once | INTRAMUSCULAR | Status: AC
  Administered 2022-04-25: 1 mg via INTRAVENOUS
  Filled 2022-04-25: qty 1

## 2022-04-25 MED ORDER — ONDANSETRON HCL 4 MG/2ML IJ SOLN
4.0000 mg | Freq: Once | INTRAMUSCULAR | Status: AC
  Administered 2022-04-25: 4 mg via INTRAVENOUS
  Filled 2022-04-25: qty 2

## 2022-04-25 NOTE — ED Triage Notes (Signed)
Patient said she has had lower back pain for a week along with chills. Had a C-section 3 weeks ago, no drainage from the site. Patient said she has not been around anyone sick. No cough. But did have painful urination.

## 2022-04-25 NOTE — Discharge Instructions (Addendum)
Please return to the ED with any new or worsening symptoms such as fevers, increasing back pain, nausea or vomiting Please read the attached guide concerning urinary tract infections Please take entire course of antibiotics.  You are being placed on 5 days of antibiotics we will take 2 times daily. Please follow-up with your PCP Please continue to remain hydrated as an outpatient

## 2022-04-25 NOTE — ED Notes (Addendum)
Pt transported to CT ?

## 2022-04-25 NOTE — ED Provider Notes (Signed)
[facility] DEPT Provider Note   CSN: [id number] Arrival date & time: 04/25/22  0407     History  Chief Complaint  Patient presents with   Back Pain    Colleen Patterson is a 36 y.o. female.  HPI   Patient with medical history including status post cesarean section 2 weeks ago uncomplicated presents with complaints of left lower back pain as well as chills.  Patient states left lower back pains going on for about a week's time, pain is constant, does not radiate, denies any paresthesias or weakness moving down her legs no saddle paresthesias no urinary or bowel incontinency, she denies any traumatic injury with it, she states that she started to feel chills yesterday, has felt feverish, denies any vomiting, states she is felt slightly nauseous, no urinary symptoms, no vaginal discharge or vaginal bleeding, she denies any pelvic pain, she has no history of kidney stones, denies any URI-like symptoms or recent sick contacts.    Home Medications Prior to Admission medications   Medication Sig Start Date End Date Taking? Authorizing Provider  ferrous sulfate 325 (65 FE) MG tablet Take 1 tablet (325 mg total) by mouth every other day. Take with a fruit, avoid within 1 hr of dairy intake 04/05/22 06/04/22  Ndulue, Chiagoziem J, MD  ibuprofen (ADVIL) 600 MG tablet Take 1 tablet (600 mg total) by mouth every 6 (six) hours. Patient not taking: Reported on 04/13/2022 04/04/22   Ndulue, Chiagoziem J, MD  oxyCODONE (OXY IR/ROXICODONE) 5 MG immediate release tablet Take 1 tablet (5 mg total) by mouth every 6 (six) hours as needed for moderate pain. Patient not taking: Reported on 04/13/2022 04/04/22   Ndulue, Chiagoziem J, MD  polyethylene glycol powder (GLYCOLAX/MIRALAX) 17 GM/SCOOP powder Take 17 g by mouth daily. 04/04/22   Ndulue, Chiagoziem J, MD  Prenatal Vit-Fe Fumarate-FA (PREPLUS) 27-1 MG TABS Take 1 tablet by mouth daily. 09/06/21   Woodroe Mode, MD      Allergies     Patient has no known allergies.    Review of Systems   Review of Systems  Constitutional:  Negative for chills and fever.  Respiratory:  Negative for shortness of breath.   Cardiovascular:  Negative for chest pain.  Gastrointestinal:  Positive for nausea. Negative for abdominal pain and vomiting.  Genitourinary:  Positive for frequency.  Neurological:  Negative for headaches.    Physical Exam Updated Vital Signs BP 100/66   Pulse (!) 111   Temp 100.3 F (37.9 C) (Oral)   Resp 17   Ht 5' 5.5" (1.664 m)   Wt 77.1 kg   SpO2 90% Comment: RN aware  BMI 27.86 kg/m  Physical Exam Vitals and nursing note reviewed.  Constitutional:      General: She is not in acute distress.    Appearance: She is not ill-appearing.  HENT:     Head: Normocephalic and atraumatic.     Nose: No congestion.  Eyes:     Conjunctiva/sclera: Conjunctivae normal.  Cardiovascular:     Rate and Rhythm: Normal rate and regular rhythm.     Pulses: Normal pulses.     Heart sounds: No murmur heard.    No friction rub. No gallop.  Pulmonary:     Effort: No respiratory distress.     Breath sounds: No wheezing, rhonchi or rales.  Abdominal:     Palpations: Abdomen is soft.     Tenderness: There is abdominal tenderness. There is no right CVA tenderness  or left CVA tenderness.     Comments: Nondistended, soft, surgical scar noted lower pelvic region no evidence of infection present, patient some slight left lower quadrant tenderness, without guarding rebound tension peritoneal sign she does have noted left-sided flank tenderness without noted CVA tenderness.  Musculoskeletal:     Comments: Spine was palpated was nontender to palpation no step-off deformities noted no overlying skin changes, pain was noted more in the left flank, she has 5-5 strength neurovascularly intact in the lower extremities negative straight leg raise.  Skin:    General: Skin is warm and dry.  Neurological:     Mental Status: She is  alert.  Psychiatric:        Mood and Affect: Mood normal.     ED Results / Procedures / Treatments   Labs (all labs ordered are listed, but only abnormal results are displayed) Labs Reviewed  BASIC METABOLIC PANEL - Abnormal; Notable for the following components:      Result Value   Glucose, Bld 104 (*)    Creatinine, Ser 1.04 (*)    All other components within normal limits  CBC WITH DIFFERENTIAL/PLATELET - Abnormal; Notable for the following components:   WBC 11.3 (*)    Neutro Abs 9.1 (*)    All other components within normal limits  HEPATIC FUNCTION PANEL - Abnormal; Notable for the following components:   AST 14 (*)    Indirect Bilirubin 0.2 (*)    All other components within normal limits  URINE CULTURE  LIPASE, BLOOD  LACTIC ACID, PLASMA  URINALYSIS, ROUTINE W REFLEX MICROSCOPIC  LACTIC ACID, PLASMA    EKG None  Radiology CT Abdomen Pelvis W Contrast  Result Date: 04/25/2022 CLINICAL DATA:  Postop abdominal pain. Lower back pain. C-section 3 weeks ago. EXAM: CT ABDOMEN AND PELVIS WITH CONTRAST TECHNIQUE: Multidetector CT imaging of the abdomen and pelvis was performed using the standard protocol following bolus administration of intravenous contrast. RADIATION DOSE REDUCTION: This exam was performed according to the departmental dose-optimization program which includes automated exposure control, adjustment of the mA and/or kV according to patient size and/or use of iterative reconstruction technique. CONTRAST:  184m OMNIPAQUE IOHEXOL 300 MG/ML  SOLN COMPARISON:  None Available. FINDINGS: Lower chest:  No contributory findings. Hepatobiliary: No focal liver abnormality.No evidence of biliary obstruction or stone. Pancreas: Unremarkable. Spleen: Unremarkable. Adrenals/Urinary Tract: Negative adrenals. Prominence of bilateral urothelium at the renal pelves and upper ureters, with question faint perinephric stranding on the right. Unremarkable bladder. Stomach/Bowel: Moderate  stool retention. No bowel obstruction. No appendicitis. Vascular/Lymphatic: No acute vascular abnormality. No mass or adenopathy. Reproductive:Postpartum uterus.  Located IUD. Other: No ascites or pneumoperitoneum. Abdominal wall scarring. No collection. Musculoskeletal: No acute abnormalities. IMPRESSION: Bilateral renal pelvis and upper ureter urothelial thickening with mild fat inflammation on the right, suspect ascending UTI. Electronically Signed   By: JJorje GuildM.D.   On: 04/25/2022 06:30    Procedures Procedures    Medications Ordered in ED Medications  cefTRIAXone (ROCEPHIN) 1 g in sodium chloride 0.9 % 100 mL IVPB (has no administration in time range)  sodium chloride 0.9 % bolus 1,000 mL (1,000 mLs Intravenous New Bag/Given 04/25/22 0543)  ondansetron (ZOFRAN) injection 4 mg (4 mg Intravenous Given 04/25/22 0543)  HYDROmorphone (DILAUDID) injection 1 mg (1 mg Intravenous Given 04/25/22 0543)  iohexol (OMNIPAQUE) 300 MG/ML solution 100 mL (100 mLs Intravenous Contrast Given 04/25/22 0604)    ED Course/ Medical Decision Making/ A&P  Medical Decision Making Amount and/or Complexity of Data Reviewed Labs: ordered. Radiology: ordered.  Risk Prescription drug management.  This patient presents to the ED for concern of back pain and flank pain, this involves an extensive number of treatment options, and is a complaint that carries with it a high risk of complications and morbidity.  The differential diagnosis includes UTI, pyelo-, postop complication    Additional history obtained:  Additional history obtained from N/A External records from outside source obtained and reviewed including OB/GYN note   Co morbidities that complicate the patient evaluation  N/A  Social Determinants of Health:  N/A     Lab Tests:  I Ordered, and personally interpreted labs.  The pertinent results include: CBC shows slight leukocytosis 11.3, BMP shows glucose  of 104, creatinine 1.04, hepatic panel shows AST 14 and direct T. bili 0.2 lactic 1 lipase 28   Imaging Studies ordered:  I ordered imaging studies including CT abdomen pelvis I independently visualized and interpreted imaging which showed bilateral renal pelvis and upper ureter thickening with mild met inflammation on the right suspecting a sending UTI I agree with the radiologist interpretation   Cardiac Monitoring:  The patient was maintained on a cardiac monitor.  I personally viewed and interpreted the cardiac monitored which showed an underlying rhythm of: N/A    Medicines ordered and prescription drug management:  I ordered medication including fluids, pain medication, antiemetics I have reviewed the patients home medicines and have made adjustments as needed  Critical Interventions:  N/A   Reevaluation:  Presents with back pain with chills, on my exam she had left-sided flank tenderness, concern for possible UTI/Pilo at this time, also cannot exclude possibility of post op infection, will obtain lab work imaging and reassess  Imaging consistent with likely a sending UTI, will start her on ceftriaxone and continue to monitor    Consultations Obtained:  N/A   Test Considered:  N/A    Rule out low suspicion for lower lobe pneumonia as lung sounds are clear bilaterally, will defer imaging at this time.  I have low suspicion for liver or gallbladder abnormality as she has no right upper quadrant tenderness, liver enzymes, alk phos, T bili all within normal limits.  Low suspicion for pancreatitis as lipase is within normal limits.  Low suspicion for ruptured stomach ulcer as she has no peritoneal sign present on exam.  Low suspicion diverticulitis, appendicitis, intra abdominal abscess as CT imaging negative these findings.      Dispostion and problem list  Due to shift change patient be handed off to [place] gross PA-C  Follow-up on UA, and reassess the  patient, likely discharge home with Bactrim for treatment of UTI, provided with antiemetics as needed, given strict return precautions.              Final Clinical Impression(s) / ED Diagnoses Final diagnoses:  Flank pain    Rx / DC Orders ED Discharge Orders     None         Marcello Fennel, PA-C 04/25/22 0704    Shanon Rosser, MD 04/25/22 2246

## 2022-04-25 NOTE — ED Provider Notes (Signed)
  Physical Exam  BP 103/65 (BP Location: Left Arm)   Pulse 90   Temp 98 F (36.7 C)   Resp 16   Ht 5' 5.5" (1.664 m)   Wt 77.1 kg   SpO2 95%   BMI 27.86 kg/m   Physical Exam  Procedures  Procedures  ED Course / MDM    Medical Decision Making Amount and/or Complexity of Data Reviewed Labs: ordered. Radiology: ordered.  Risk Prescription drug management.   Patient signed out to me at shift change.  Please see previous provider note for further details.  In short, this is a 36 year old female with medical history including Po cesarean section 2 weeks ago uncomplicated.  Patient comes in with left lower back pain as well as chills ongoing for about a week's time, constant pain, nonradiating, denies any other symptoms.  Patient denies any urinary or bowel incontinence.   Patient signed out to me pending urinalysis.  Plan is to send this patient home on Bactrim pending UA results.  Patient also in need of IV fluid resuscitation.   Update: Patient urinalysis has resulted and is positive for red blood cells, large leukocytes, nitrites.  Patient was sent home on Bactrim twice daily for 5 days as previously discussed with previous provider Berle Mull, PA-C.  Patient counseled on return precautions and she voiced understanding of these instructions.  Patient had all her questions answered to her satisfaction prior to discharge.  Patient stable at discharge.      Al Decant, PA-C 04/25/22 5465    Mardene Sayer, MD 04/25/22 1500

## 2022-04-25 NOTE — ED Notes (Signed)
Pt placed on 2L o2 [place] for comfort after dilaudid admin

## 2022-04-27 LAB — URINE CULTURE: Culture: >=100000 — AB

## 2022-04-28 ENCOUNTER — Telehealth: Payer: Self-pay | Admitting: *Deleted

## 2022-04-28 NOTE — Progress Notes (Signed)
ED Antimicrobial Stewardship Positive Culture Follow Up   Colleen Patterson is an 36 y.o. female who presented to [facility] on 04/25/2022 with a chief complaint of  Chief Complaint  Patient presents with   Back Pain    Recent Results (from the past 720 hour(s))  Urine Culture     Status: Abnormal   Collection Time: 04/25/22  7:24 AM   Specimen: Urine, Clean Catch  Result Value Ref Range Status   Specimen Description   Final    URINE, CLEAN CATCH Performed at [facility], [address], [place], [place] [address]    Special Requests   Final    NONE Performed at [facility], [address], [place], [place] [address]    Culture >=100,000 COLONIES/mL ESCHERICHIA COLI (A)  Final   Report Status 04/27/2022 FINAL  Final   Organism ID, Bacteria ESCHERICHIA COLI (A)  Final      Susceptibility   Escherichia coli - MIC*    AMPICILLIN >=32 RESISTANT Resistant     CEFAZOLIN 8 SENSITIVE Sensitive     CEFEPIME <=0.12 SENSITIVE Sensitive     CEFTRIAXONE <=0.25 SENSITIVE Sensitive     CIPROFLOXACIN 0.5 INTERMEDIATE Intermediate     GENTAMICIN <=1 SENSITIVE Sensitive     IMIPENEM <=0.25 SENSITIVE Sensitive     NITROFURANTOIN <=16 SENSITIVE Sensitive     TRIMETH/SULFA >=320 RESISTANT Resistant     AMPICILLIN/SULBACTAM >=32 RESISTANT Resistant     PIP/TAZO <=4 SENSITIVE Sensitive     * >=100,000 COLONIES/mL ESCHERICHIA COLI  Patient presented with left lower back pain and chills with no urinary symptoms or fever. Physical exam was positive for left sided flank pain with no CVA tenderness. CT of abdomen showed suspected ascending UTI. Patient grew E. Coli in urine and was sent home with Bactrim. Urinary culture showed patient's E. Coli is resistant to bactrim, but sensitive to cefazolin.  Plan New antibiotic prescription: cefadroxil 1g BID for 7 days   ED Provider: Benjiman Core, MD   Colleen Patterson, PharmD. [facility] Acute Care PGY-1  04/28/2022  11:36 AM

## 2022-04-28 NOTE — Telephone Encounter (Signed)
Post ED Visit - Positive Culture Follow-up: Unsuccessful Patient Follow-up  Culture assessed and recommendations reviewed by:  []  , Pharm.D. []  Enzo Bi, Pharm.D., BCPS AQ-ID []  , Pharm.D., BCPS []  Celedonio Miyamoto, Pharm.D., BCPS []  Palmer, Garvin Fila.D., BCPS, AAHIVP []  , Pharm.D., BCPS, AAHIVP []  Georgina Pillion, PharmD []  , PharmD, BCPS  Positive urine culture  []  Patient discharged without antimicrobial prescription and treatment is now indicated [x]  Organism is resistant to prescribed ED discharge antimicrobial []  Patient with positive blood cultures  Plan:  Stop Bactrim.  Start Cefadroxil 1 gm PO BID x 7 days, [place], MD  Unable to contact patient after 3 attempts, letter will be sent to address on file  [place] 04/28/2022, 11:58 AM

## 2022-05-12 ENCOUNTER — Ambulatory Visit: Payer: Medicaid Other

## 2022-05-13 ENCOUNTER — Telehealth: Payer: Self-pay | Admitting: Emergency Medicine

## 2022-05-13 ENCOUNTER — Other Ambulatory Visit: Payer: Self-pay | Admitting: Emergency Medicine

## 2022-05-13 DIAGNOSIS — N898 Other specified noninflammatory disorders of vagina: Secondary | ICD-10-CM

## 2022-05-13 MED ORDER — FLUCONAZOLE 150 MG PO TABS: 150.0000 mg | ORAL_TABLET | Freq: Once | ORAL | 0 refills | Status: AC

## 2022-05-13 NOTE — Telephone Encounter (Signed)
Pt reports vaginal itching, chunky white discharge x1 week.  Diflucan sent per protocol

## 2022-05-13 NOTE — Progress Notes (Signed)
Pt reports vaginal itching, chunky white discharge x1 week.  Diflucan sent per protocol 

## 2022-05-16 ENCOUNTER — Ambulatory Visit: Payer: Medicaid Other

## 2022-05-19 ENCOUNTER — Other Ambulatory Visit: Payer: Medicaid Other

## 2022-05-19 ENCOUNTER — Other Ambulatory Visit (HOSPITAL_COMMUNITY)
Admission: RE | Admit: 2022-05-19 | Discharge: 2022-05-19 | Disposition: A | Discharge status: Home / Self Care | Payer: Medicaid Other | Source: Ambulatory Visit

## 2022-05-19 ENCOUNTER — Ambulatory Visit: Payer: Medicaid Other

## 2022-05-19 ENCOUNTER — Ambulatory Visit (INDEPENDENT_AMBULATORY_CARE_PROVIDER_SITE_OTHER): Payer: Medicaid Other

## 2022-05-19 DIAGNOSIS — N898 Other specified noninflammatory disorders of vagina: Secondary | ICD-10-CM | POA: Diagnosis present

## 2022-05-19 DIAGNOSIS — T8332XA Displacement of intrauterine contraceptive device, initial encounter: Secondary | ICD-10-CM | POA: Insufficient documentation

## 2022-05-19 DIAGNOSIS — O24439 Gestational diabetes mellitus in the puerperium, unspecified control: Secondary | ICD-10-CM

## 2022-05-19 DIAGNOSIS — Z98891 History of uterine scar from previous surgery: Secondary | ICD-10-CM | POA: Diagnosis not present

## 2022-05-19 DIAGNOSIS — O24410 Gestational diabetes mellitus in pregnancy, diet controlled: Secondary | ICD-10-CM

## 2022-05-19 MED ORDER — BORIC ACID CRYS: 600.0000 mg | CRYSTALS | Freq: Every day | 5 refills | Status: AC

## 2022-05-19 NOTE — Progress Notes (Signed)
Post Partum Visit Note  Colleen Patterson is a 36 y.o. [phone] female who presents for a postpartum visit. She is 6 week postpartum following a repeat cesarean section.  I have fully reviewed the prenatal and intrapartum course. The delivery was at [redacted]w[redacted]d gestational weeks.  Anesthesia: spinal. Postpartum course has been unremarkable. Baby is doing well. Baby is feeding by bottle - Similac Total Comfort . Bleeding no bleeding. Bowel function is normal. Bladder function is normal. Patient is sexually active. Contraception method is IUD. Postpartum depression screening: negative. EPDS=0  Patient denies depression and endorses adequate sleep.  States she receives support, with infant care, from her husband.  She endorses safety and denies DVA at home.  She does report some constipation that is relieved with Miralax usage. Last BM yesterday. She has had a menstrual cycle and reports it was "somewhat heavy," but without cramping.  She has been unable to palpate her IUD strings and is concerned.  She also reports some vaginal itching that has been unrelieved with Diflucan dosing. She has had sexual activity with some discomfort during initial penetration, but was otherwise okay.   The pregnancy intention screening data noted above was reviewed. Potential methods of contraception were discussed. The patient elected to proceed with No data recorded.   Edinburgh Postnatal Depression Scale - 05/19/22 0935       Edinburgh Postnatal Depression Scale:  In the Past 7 Days   I have been able to laugh and see the funny side of things. 0    I have looked forward with enjoyment to things. 0    I have blamed myself unnecessarily when things went wrong. 0    I have been anxious or worried for no good reason. 0    I have felt scared or panicky for no good reason. 0    Things have been getting on top of me. 0    I have been so unhappy that I have had difficulty sleeping. 0    I have felt sad or miserable. 0    I have  been so unhappy that I have been crying. 0    The thought of harming myself has occurred to me. 0    Edinburgh Postnatal Depression Scale Total 0             Health Maintenance Due  Topic Date Due   COVID-19 Vaccine (1) Never done   INFLUENZA VACCINE  Never done    The following portions of the patient's history were reviewed and updated as appropriate: allergies, current medications, past family history, past medical history, past social history, past surgical history, and problem list.  Review of Systems Pertinent items are noted in HPI.  Objective:  BP 107/74   Pulse 62   Wt 195 lb (88.5 kg)   LMP 05/14/2022   Breastfeeding Yes   BMI 31.96 kg/m    General:  alert, cooperative, and no distress   Breasts:  not indicated  Lungs: No respiratory distress  Heart:  Regular rate  Abdomen: Soft, NT, Incision clean and intact. No redness.      Wound well approximated incision  GU exam:   Moderate amt grayish brown discharge in vault. No apparent odor.  Cervix pink, no active bleeding or discharge. No strings visualized. Os swept with cytobrush x2 without visualization of strings.   CV collected       Assessment:   6 week postpartum exam. H/O GDM S/P C/S Uterine Enlargement  IUD strings  not visualized Vaginal Discharge/Itching Plan:  -CV collected for vaginal itching/discharge. -Patient requests previous prescription for Boric Acid to be sent to pharmacy on file.  Request fulfilled. -Informed that IUD strings not visualized. -Also informed of enlarged uterus.  Reassured that may be due to postpartum state and more time needed. Further reassured that [place] will identify any abnormalities. -Order placed for pelvic US. -GTT completed today.  Abnormal results will require follow up with PCP.  -RTO in one year for annual exam or earlier for repeat IUD placement if IUD malpositioned or not noted on [place].   Essential components of care per ACOG recommendations:  1.  Mood and well  being: Patient with negative depression screening today. Reviewed local resources for support.  - Patient tobacco use? No.   - hx of drug use? No.    2. Infant care and feeding:  -Patient currently breastmilk feeding? No.  -Social determinants of health (SDOH) reviewed in EPIC. No concerns  3. Sexuality, contraception and birth spacing - Patient does not want a pregnancy in the next year.  Desired family size is 4 children.  - Reviewed reproductive life planning. Reviewed contraceptive methods based on pt preferences and effectiveness.  Patient desired IUD or IUS today.   - Discussed birth spacing of 18 months  4. Sleep and fatigue -Encouraged family/partner/community support of 4 hrs of uninterrupted sleep to help with mood and fatigue  5. Physical Recovery  - Discussed patients delivery and complications. She describes her labor as good. - Patient had a C-section repeat; no problems after deliver. Patient had a  N/A  laceration.  - Patient has urinary incontinence? No. - Patient is safe to resume physical and sexual activity  6.  Health Maintenance - HM due items addressed Yes - Last pap smear  Diagnosis  Date Value Ref Range Status  06/21/2021   Final   - Negative for intraepithelial lesion or malignancy (NILM)   Pap smear not done at today's visit.  -Breast Cancer screening indicated? No.   7. Chronic Disease/Pregnancy Condition follow up: Gestational Diabetes  - 2 hr completed -Informed that if abnormal will send for   Maryann Conners, [place] for [place]

## 2022-05-20 LAB — CERVICOVAGINAL ANCILLARY ONLY
Bacterial Vaginitis (gardnerella): POSITIVE — AB
Candida Glabrata: NEGATIVE
Candida Vaginitis: NEGATIVE
Comment: NEGATIVE
Comment: NEGATIVE
Comment: NEGATIVE

## 2022-05-20 LAB — GLUCOSE TOLERANCE, 2 HOURS
Glucose, 2 hour: 89 mg/dL (ref 70–139)
Glucose, GTT - Fasting: 86 mg/dL (ref 70–99)

## 2022-05-22 MED ORDER — METRONIDAZOLE 0.75 % VA GEL: 1.0000 | Freq: Every day | VAGINAL | 1 refills | Status: AC

## 2022-05-22 NOTE — Addendum Note (Signed)
Addended by: Gavin Pound L on: 05/22/2022 09:13 PM   Modules accepted: Orders

## 2022-05-26 ENCOUNTER — Ambulatory Visit (HOSPITAL_BASED_OUTPATIENT_CLINIC_OR_DEPARTMENT_OTHER): Admission: RE | Admit: 2022-05-26 | Payer: Medicaid Other | Source: Ambulatory Visit

## 2022-05-27 ENCOUNTER — Ambulatory Visit (HOSPITAL_BASED_OUTPATIENT_CLINIC_OR_DEPARTMENT_OTHER): Admission: RE | Admit: 2022-05-27 | Payer: Medicaid Other | Source: Ambulatory Visit

## 2022-05-31 ENCOUNTER — Ambulatory Visit (HOSPITAL_BASED_OUTPATIENT_CLINIC_OR_DEPARTMENT_OTHER)
Admission: RE | Admit: 2022-05-31 | Discharge: 2022-05-31 | Disposition: A | Discharge status: Home / Self Care | Payer: Medicaid Other | Source: Ambulatory Visit

## 2022-05-31 DIAGNOSIS — Z98891 History of uterine scar from previous surgery: Secondary | ICD-10-CM | POA: Diagnosis present

## 2022-05-31 DIAGNOSIS — T8332XA Displacement of intrauterine contraceptive device, initial encounter: Secondary | ICD-10-CM | POA: Insufficient documentation

## 2022-09-19 ENCOUNTER — Ambulatory Visit: Payer: Medicaid Other | Admitting: Podiatry

## 2022-09-20 ENCOUNTER — Ambulatory Visit (INDEPENDENT_AMBULATORY_CARE_PROVIDER_SITE_OTHER): Payer: Medicaid Other | Admitting: Podiatry

## 2022-09-20 ENCOUNTER — Ambulatory Visit (INDEPENDENT_AMBULATORY_CARE_PROVIDER_SITE_OTHER): Payer: Medicaid Other

## 2022-09-20 DIAGNOSIS — M79671 Pain in right foot: Secondary | ICD-10-CM

## 2022-09-20 DIAGNOSIS — S92251A Displaced fracture of navicular [scaphoid] of right foot, initial encounter for closed fracture: Secondary | ICD-10-CM | POA: Diagnosis not present

## 2022-09-20 DIAGNOSIS — M19071 Primary osteoarthritis, right ankle and foot: Secondary | ICD-10-CM | POA: Diagnosis not present

## 2022-09-20 DIAGNOSIS — S92251S Displaced fracture of navicular [scaphoid] of right foot, sequela: Secondary | ICD-10-CM | POA: Diagnosis not present

## 2022-09-20 DIAGNOSIS — M79672 Pain in left foot: Secondary | ICD-10-CM | POA: Diagnosis not present
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
## 2022-09-28 ENCOUNTER — Ambulatory Visit: Payer: Medicaid Other | Admitting: Podiatry

## 2022-10-11 ENCOUNTER — Other Ambulatory Visit: Payer: Medicaid Other

## 2022-10-12 ENCOUNTER — Inpatient Hospital Stay: Admission: RE | Admit: 2022-10-12 | Payer: Medicaid Other | Source: Ambulatory Visit

## 2022-10-14 ENCOUNTER — Ambulatory Visit
Admission: RE | Admit: 2022-10-14 | Discharge: 2022-10-14 | Disposition: A | Discharge status: Home / Self Care | Payer: Medicaid Other | Source: Ambulatory Visit | Attending: Podiatry | Admitting: Podiatry

## 2022-10-14 DIAGNOSIS — M19071 Primary osteoarthritis, right ankle and foot: Secondary | ICD-10-CM

## 2022-10-31 ENCOUNTER — Ambulatory Visit: Payer: Medicaid Other | Admitting: Podiatry

## 2022-11-18 ENCOUNTER — Ambulatory Visit (INDEPENDENT_AMBULATORY_CARE_PROVIDER_SITE_OTHER): Payer: Medicaid Other | Admitting: Podiatry

## 2022-11-18 DIAGNOSIS — M7751 Other enthesopathy of right foot: Secondary | ICD-10-CM

## 2022-11-21 NOTE — Progress Notes (Signed)
Subjective: Chief Complaint  Patient presents with   Fracture    Follow up after CT   37 year old female presents the office today for ongoing foot pain discuss CT scan results.  She is overall she is doing about the same as she gets chronic discomfort along the medial aspect of the foot.   Objective: AAO x3, NAD DP/PT pulses palpable bilaterally, CRT less than 3 seconds There is decreased range of motion of subtalar joint there is decreased range of motion particular with eversion.  Tenderness palpation on the medial aspect of the foot along the talonavicular, navicular cuneiform joint.  No area pinpoint tenderness.  Trace edema.  No erythema or warmth.  No open lesions. No pain with calf compression, swelling, warmth, erythema  Assessment: Arthritis right foot  Plan: -All treatment options discussed with the patient including all alternatives, risks, complications.  -I reviewed the CT with her.  Discussed the conservative as well as surgical treatment options.  For now she is to continue his shoes and good arch support.  We discussed surgical intervention.  We discussed subtalar joint releases as well as medial column fusion.  She is going to think about this and plan for this in the future. -Patient encouraged to call the office with any questions, concerns, change in symptoms.   Trula Slade DPM

## 2023-01-03 ENCOUNTER — Encounter: Payer: Self-pay | Admitting: Podiatry

## 2023-09-02 IMAGING — US US OB COMP LESS 14 WK
1 series · 14 of 28 positions shown · non-contrast
Comparison: None.

CLINICAL DATA: Vaginal bleeding, positive pregnancy test

EXAM:
OBSTETRIC <14 WK ULTRASOUND; TRANSVAGINAL OB ULTRASOUND
TECHNIQUE: Transvaginal ultrasound was performed for complete evaluation of the
gestation as well as the maternal uterus, adnexal regions, and
pelvic cul-de-sac.

[Series 1: us ob comp less 14 wk · 14 of 97 slices shown]
[im 4/97]
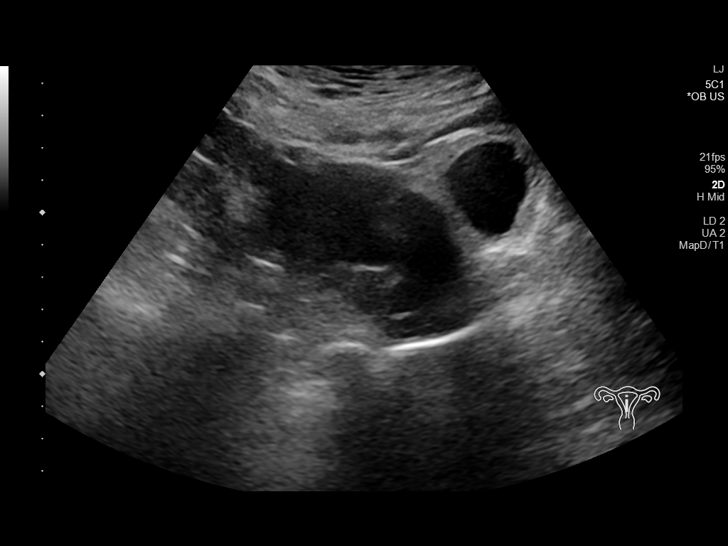
[im 11/97]
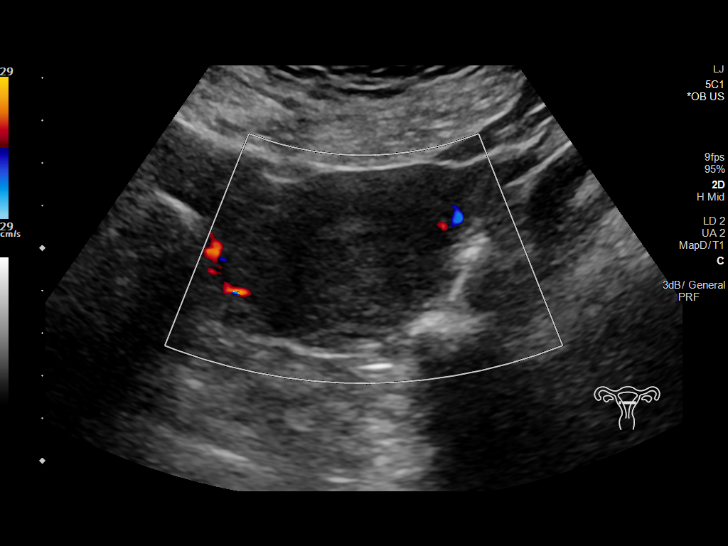
[im 18/97]
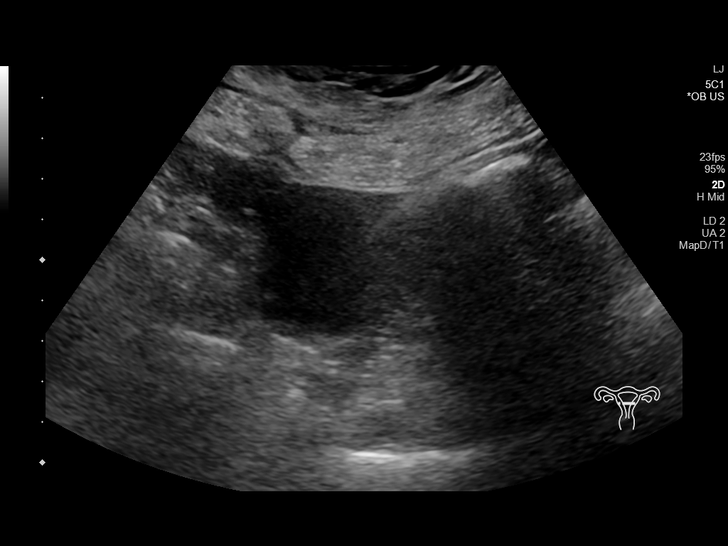
[im 25/97]
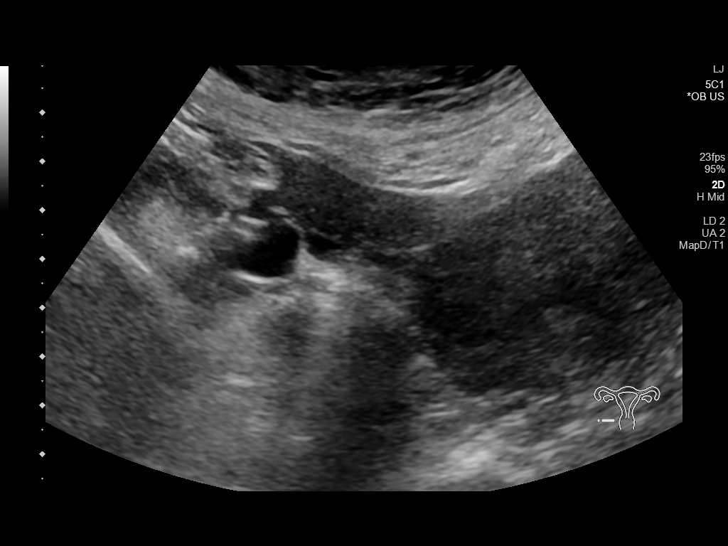
[im 33/97]
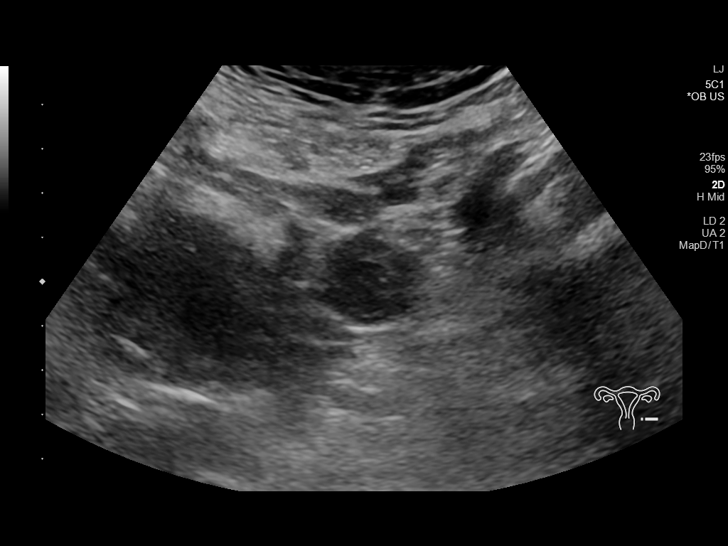
[im 40/97]
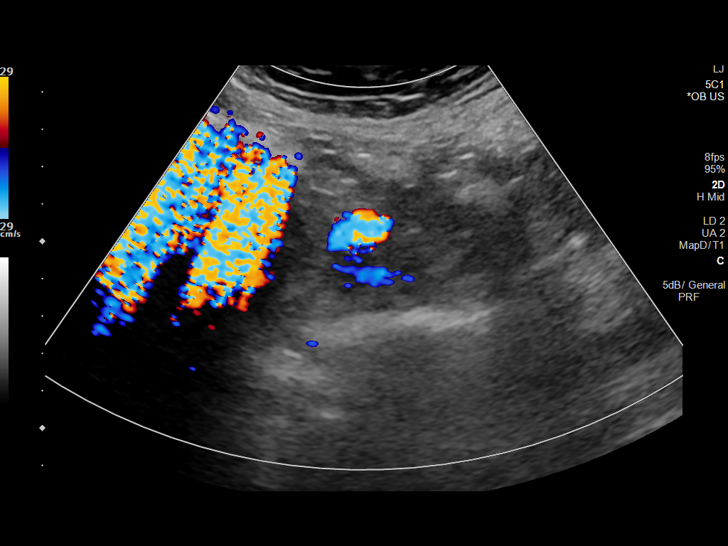
[im 47/97]
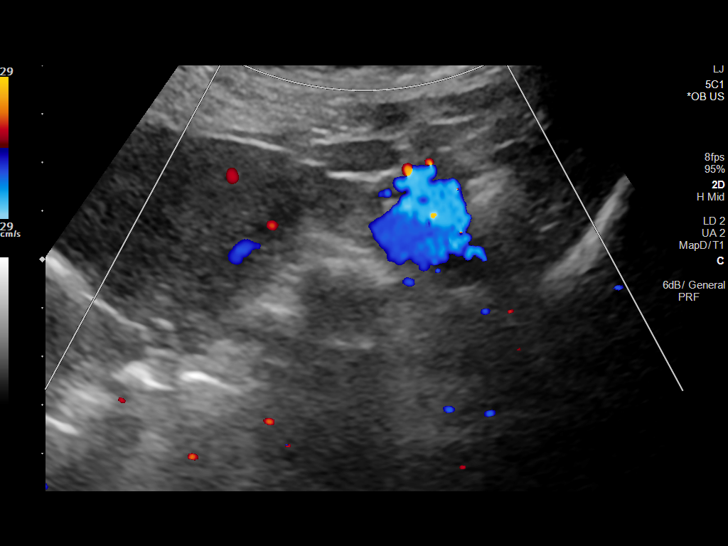
[im 54/97]
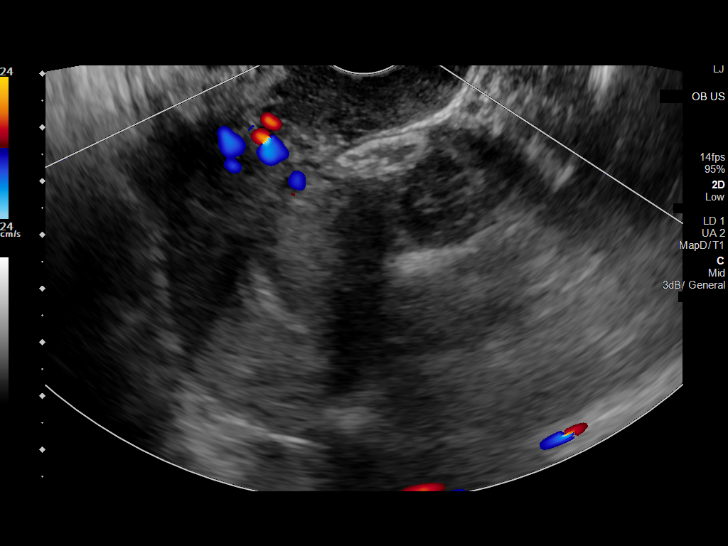
[im 61/97]
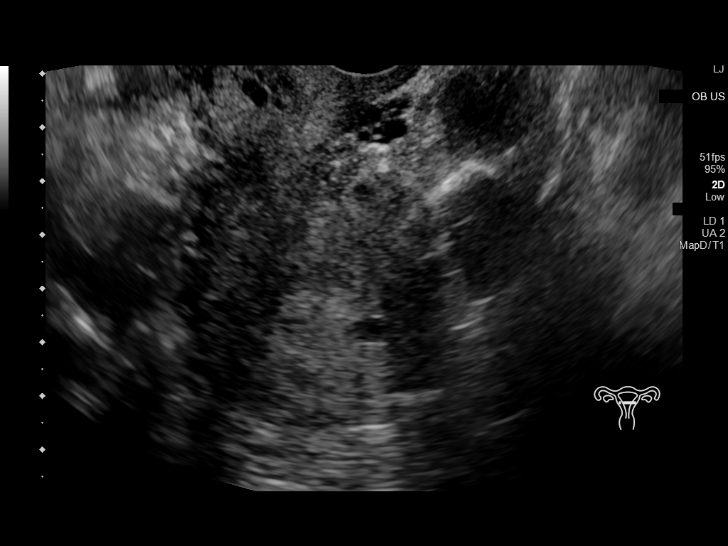
[im 68/97]
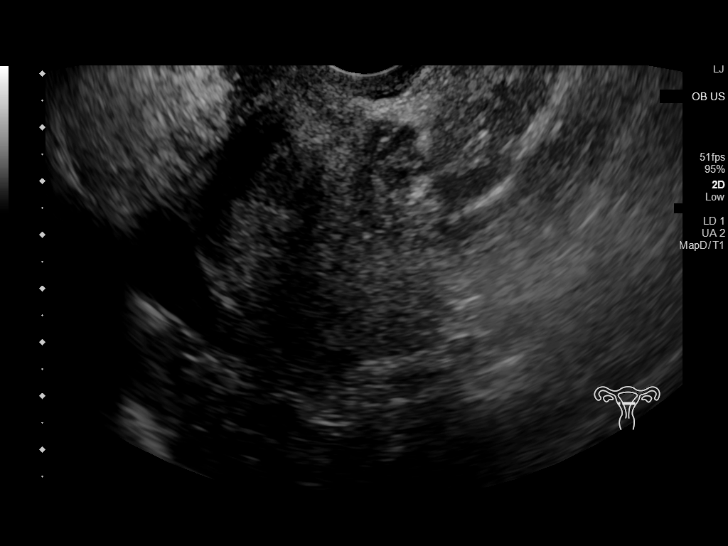
[im 75/97]
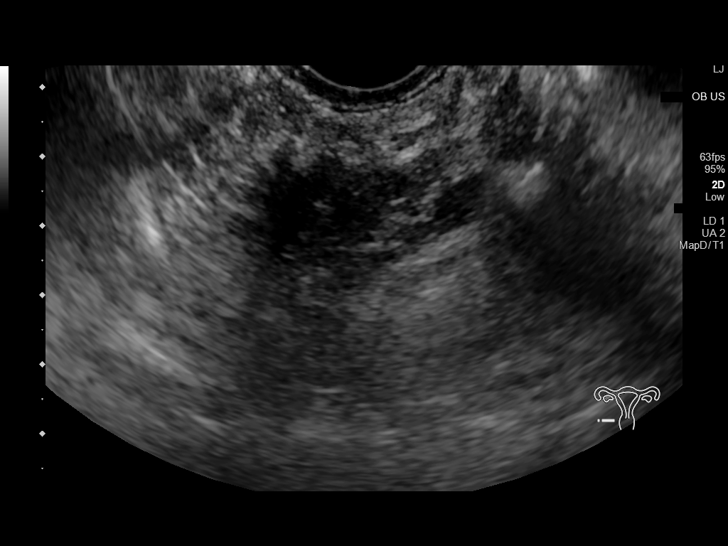
[im 82/97]
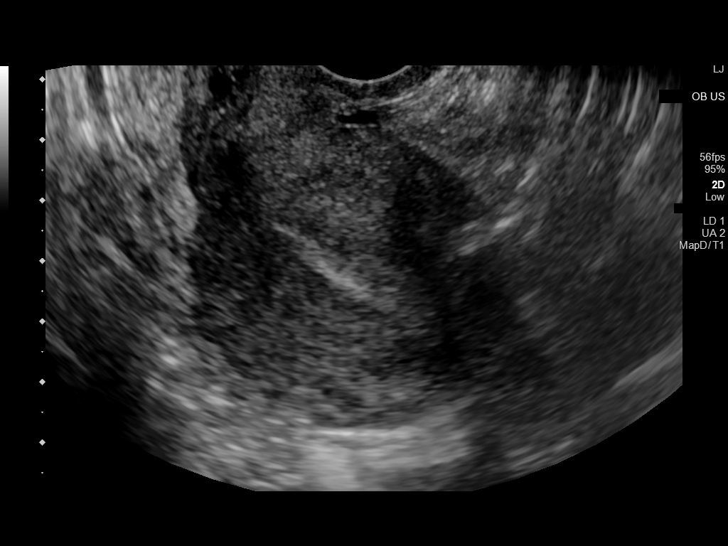
[im 89/97]
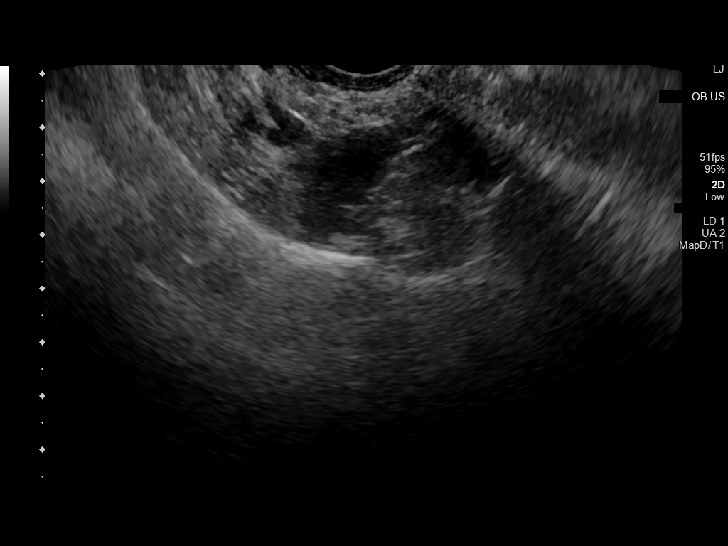
[im 97/97]
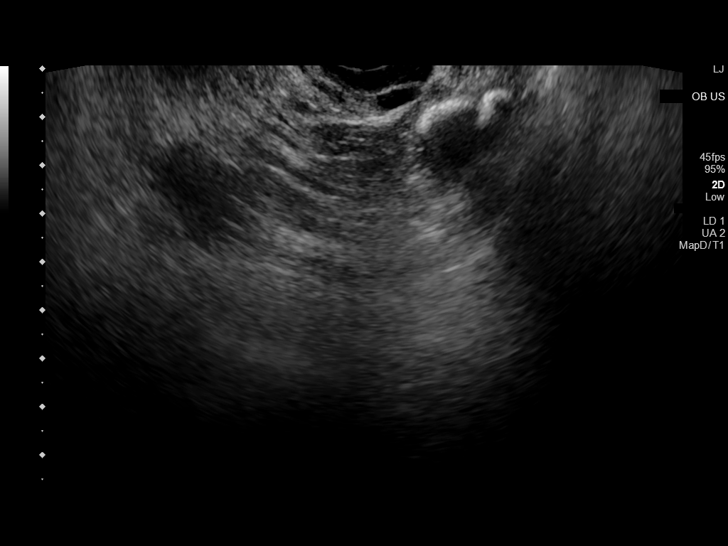

[14 of 28 positions shown; findings below may reference images not displayed]

FINDINGS: Intrauterine gestational sac: There is a small amount of fluid
versus tiny gestational sac measuring 5 mm in the uterus (image
66/96).

Yolk sac:  Not Visualized.

Embryo:  Not Visualized.

Cardiac Activity: Not Visualized.

Subchorionic hemorrhage:  None visualized.

Maternal uterus/adnexae: Normal appearance of the bilateral ovaries.
No free fluid in the pelvis.
IMPRESSION: Tiny amount of fluid versus very early gestational sac in the
uterus. No embryo or cardiac activity identified. Serial beta HCG is
suggested. Consider repeat pelvic ultrasound as clinically
indicated.

## 2023-09-02 IMAGING — US US OB TRANSVAGINAL
1 series · 14 of 28 positions shown · non-contrast
Comparison: None.

CLINICAL DATA: Vaginal bleeding, positive pregnancy test

EXAM:
OBSTETRIC <14 WK ULTRASOUND; TRANSVAGINAL OB ULTRASOUND
TECHNIQUE: Transvaginal ultrasound was performed for complete evaluation of the
gestation as well as the maternal uterus, adnexal regions, and
pelvic cul-de-sac.

[Series 1: us ob transvaginal · 14 of 97 slices shown]
[im 4/97]
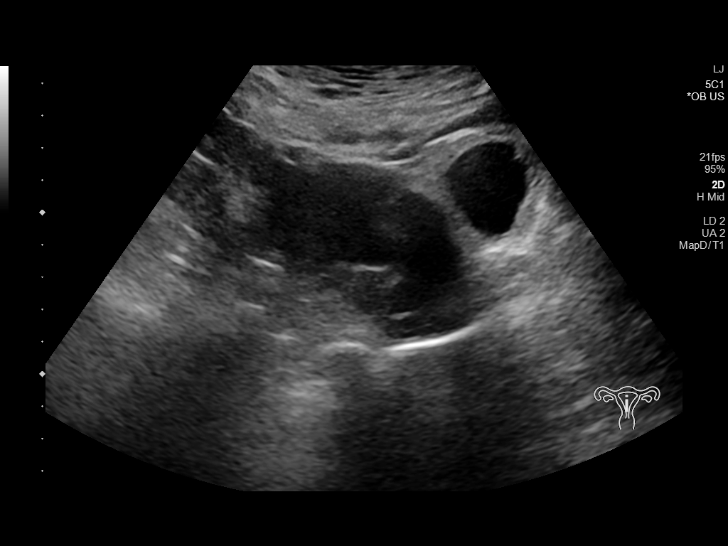
[im 11/97]
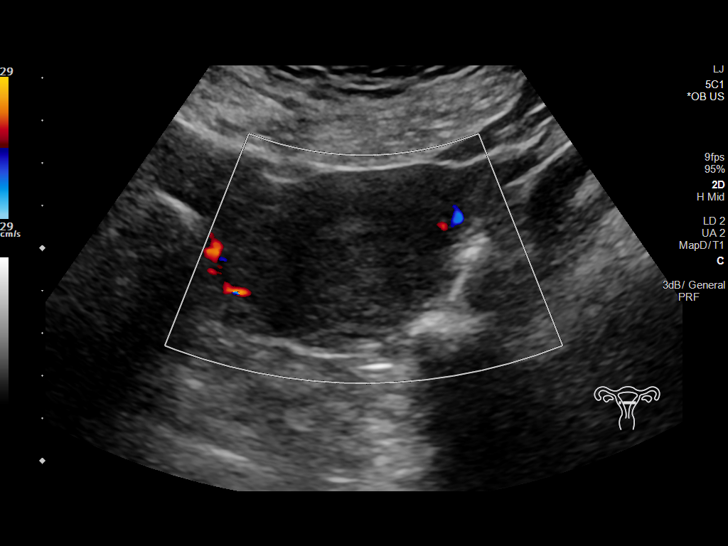
[im 18/97]
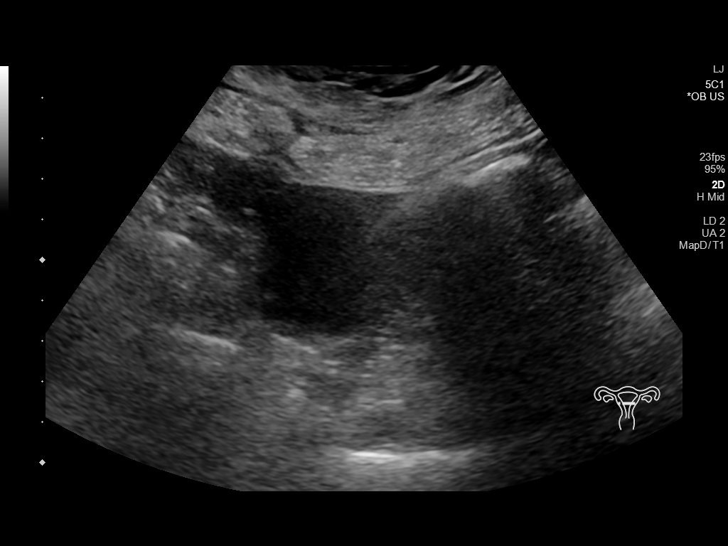
[im 25/97]
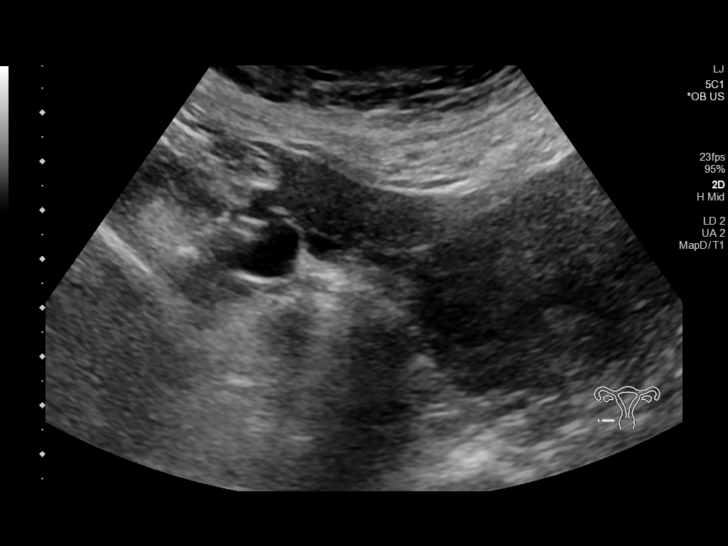
[im 33/97]
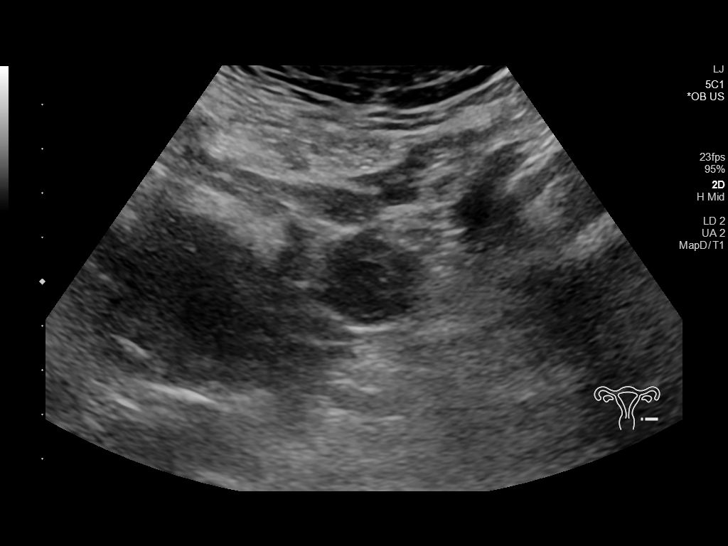
[im 40/97]
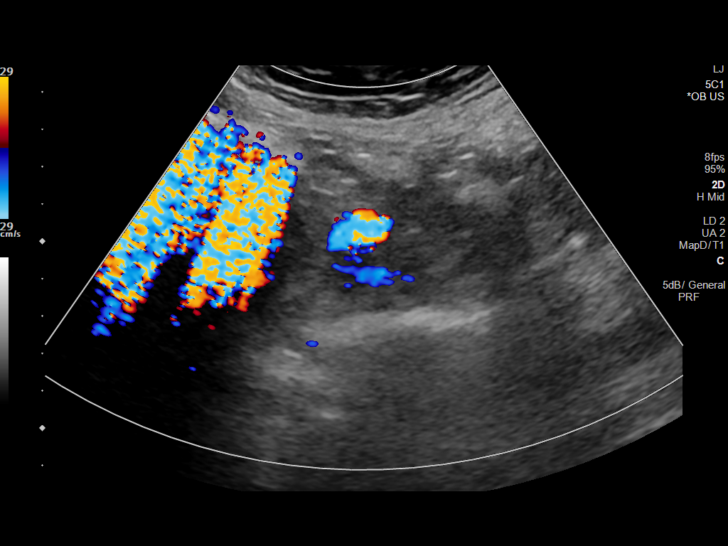
[im 47/97]
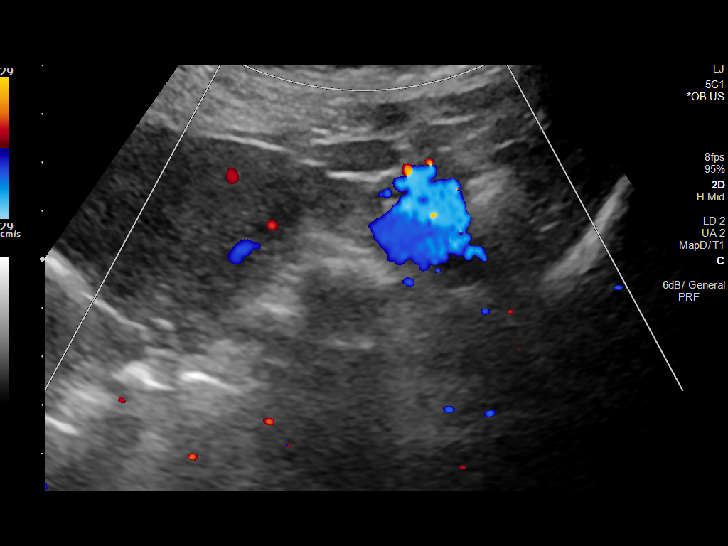
[im 54/97]
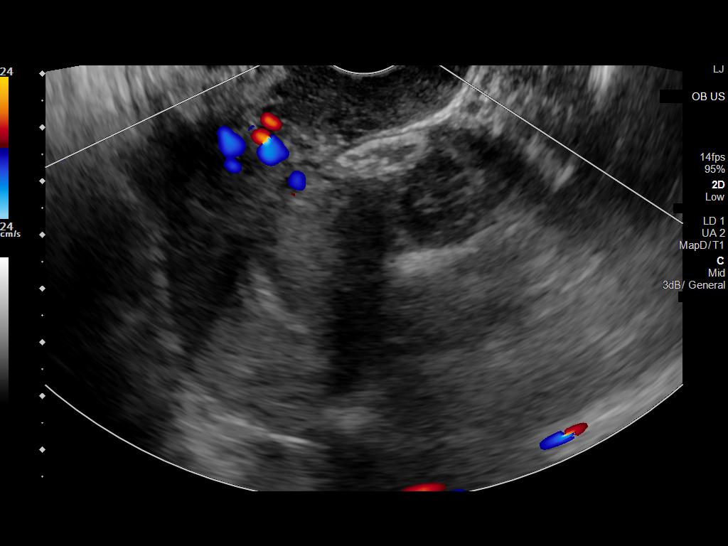
[im 61/97]
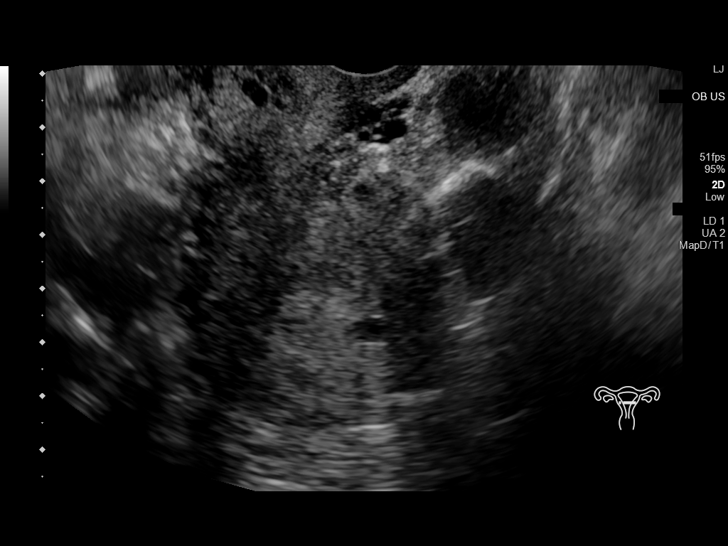
[im 68/97]
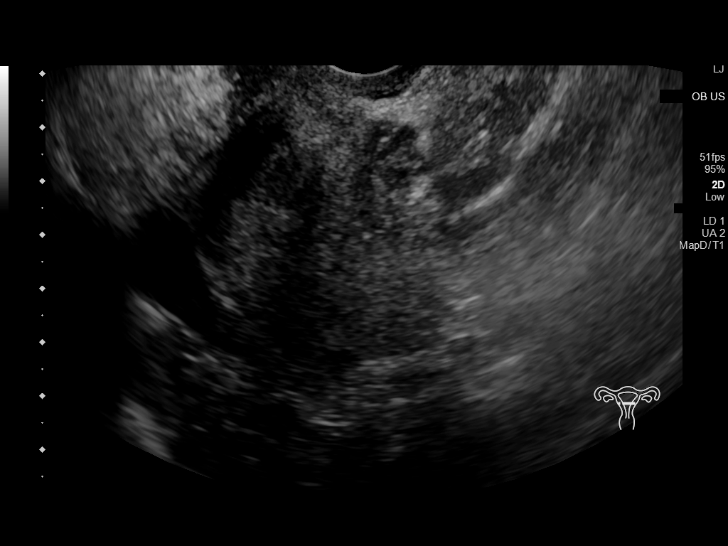
[im 75/97]
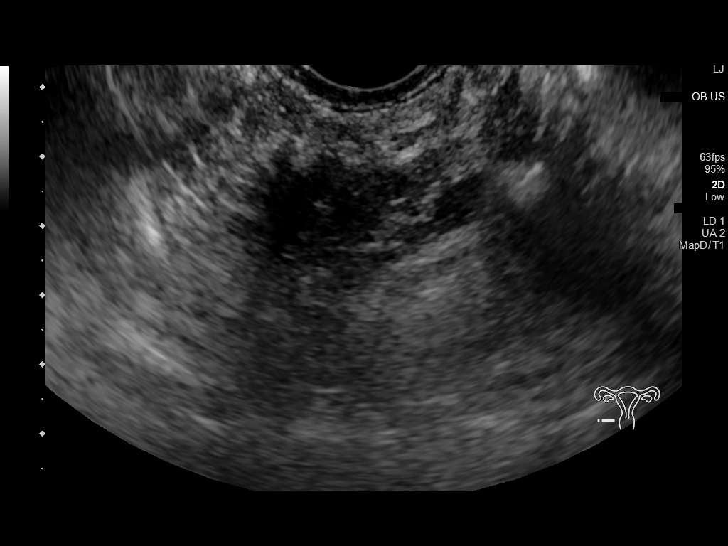
[im 82/97]
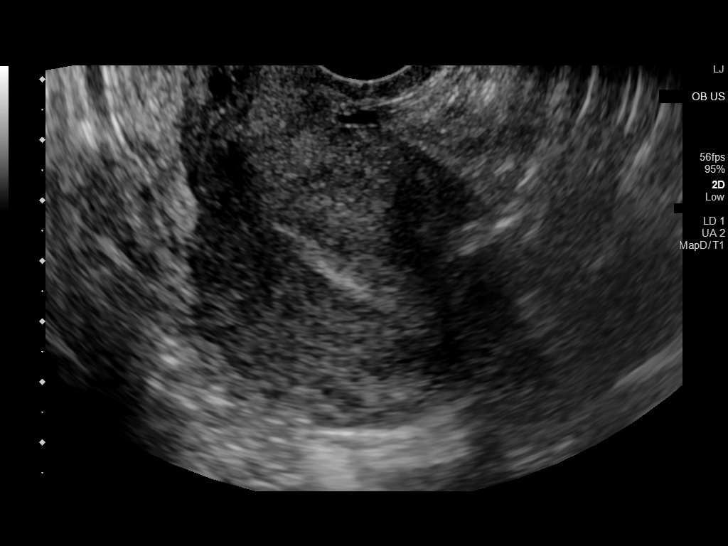
[im 89/97]
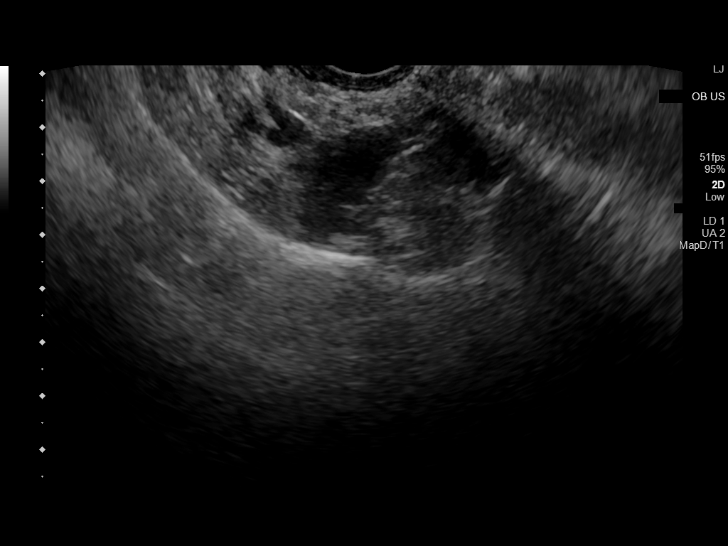
[im 97/97]
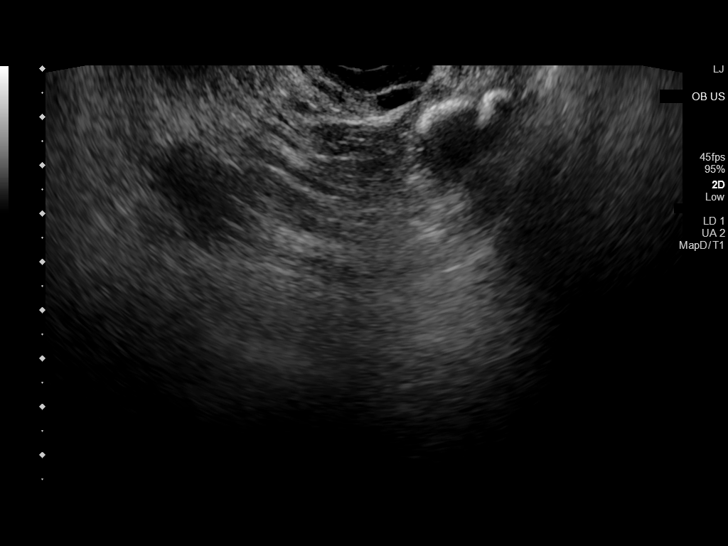

[14 of 28 positions shown; findings below may reference images not displayed]

FINDINGS: Intrauterine gestational sac: There is a small amount of fluid
versus tiny gestational sac measuring 5 mm in the uterus (image
66/96).

Yolk sac:  Not Visualized.

Embryo:  Not Visualized.

Cardiac Activity: Not Visualized.

Subchorionic hemorrhage:  None visualized.

Maternal uterus/adnexae: Normal appearance of the bilateral ovaries.
No free fluid in the pelvis.
IMPRESSION: Tiny amount of fluid versus very early gestational sac in the
uterus. No embryo or cardiac activity identified. Serial beta HCG is
suggested. Consider repeat pelvic ultrasound as clinically
indicated.

## 2024-02-29 ENCOUNTER — Ambulatory Visit: Admitting: Podiatry

## 2024-03-05 ENCOUNTER — Other Ambulatory Visit: Payer: Self-pay

## 2024-03-05 ENCOUNTER — Emergency Department (HOSPITAL_COMMUNITY)
Admission: EM | Admit: 2024-03-05 | Discharge: 2024-03-05 | Disposition: A | Discharge status: Home / Self Care | Attending: Emergency Medicine | Admitting: Emergency Medicine

## 2024-03-05 ENCOUNTER — Encounter (HOSPITAL_COMMUNITY): Payer: Self-pay

## 2024-03-05 ENCOUNTER — Emergency Department (HOSPITAL_COMMUNITY)

## 2024-03-05 DIAGNOSIS — M25562 Pain in left knee: Secondary | ICD-10-CM | POA: Diagnosis not present

## 2024-03-05 DIAGNOSIS — M25561 Pain in right knee: Secondary | ICD-10-CM | POA: Diagnosis present

## 2024-03-05 DIAGNOSIS — W19XXXA Unspecified fall, initial encounter: Secondary | ICD-10-CM | POA: Insufficient documentation

## 2024-03-05 DIAGNOSIS — Y92512 Supermarket, store or market as the place of occurrence of the external cause: Secondary | ICD-10-CM | POA: Diagnosis not present

## 2024-03-05 DIAGNOSIS — S8001XA Contusion of right knee, initial encounter: Secondary | ICD-10-CM | POA: Insufficient documentation

## 2024-03-05 DIAGNOSIS — S8000XA Contusion of unspecified knee, initial encounter: Secondary | ICD-10-CM

## 2024-03-05 NOTE — ED Triage Notes (Signed)
 POV/ fall on 7/3/ injury to bil knees/ pt unable to bear weight mostly to right knee/ pt noticed bil feet swelling today/ pt in WC/ A&OX4

## 2024-03-05 NOTE — ED Provider Notes (Signed)
 [place] EMERGENCY DEPARTMENT AT [facility] Provider Note   CSN: [id number] Arrival date & time: 03/05/24  1647     Patient presents with: Knee Injury   Colleen Patterson is a 38 y.o. female who presents for bilateral knee pain after a fall at a grocery store several days prior, initially had no pain in either knee however over the coming days has had increasing swelling and pain to bilateral knees.  She presents today with decreased weightbearing ability, increased tenderness to the knees.   HPI     Prior to Admission medications   Medication Sig Start Date End Date Taking? Authorizing Provider  Boric Acid CRYS Place 600 mg vaginally at bedtime. Use vaginally every night for two weeks then twice a week Patient not taking: Reported on 09/20/2022 05/19/22   Synthia Raisin, CNM  ferrous sulfate  325 (65 FE) MG tablet Take 1 tablet (325 mg total) by mouth every other day. Take with a fruit, avoid within 1 hr of dairy intake Patient not taking: Reported on 05/19/2022 04/05/22 06/04/22  Ndulue, Chiagoziem J, MD  ibuprofen  (ADVIL ) 600 MG tablet Take 1 tablet (600 mg total) by mouth every 6 (six) hours. Patient not taking: Reported on 09/20/2022 04/04/22   Ndulue, Chiagoziem J, MD  metroNIDAZOLE  (METROGEL ) 0.75 % vaginal gel Place 1 Applicatorful vaginally at bedtime. Apply one applicatorful to vagina at bedtime for 5 days Patient not taking: Reported on 09/20/2022 05/22/22   Synthia Raisin, CNM  ondansetron  (ZOFRAN ) 4 MG tablet Take 1 tablet (4 mg total) by mouth every 8 (eight) hours as needed for nausea or vomiting. 04/25/22   Ruthell Lonni FALCON, PA-C  oxyCODONE  (OXY IR/ROXICODONE ) 5 MG immediate release tablet Take 1 tablet (5 mg total) by mouth every 6 (six) hours as needed for moderate pain. Patient not taking: Reported on 09/20/2022 04/04/22   Ndulue, Chiagoziem J, MD  polyethylene glycol powder (GLYCOLAX /MIRALAX ) 17 GM/SCOOP powder Take 17 g by mouth daily. Patient not taking: Reported on  09/20/2022 04/04/22   Ndulue, Chiagoziem J, MD  Prenatal Vit-Fe Fumarate-FA (PREPLUS) 27-1 MG TABS Take 1 tablet by mouth daily. Patient not taking: Reported on 09/20/2022 09/06/21   Eveline Lynwood MATSU, MD    Allergies: Patient has no known allergies.    Review of Systems  Musculoskeletal:  Positive for arthralgias and joint swelling.  All other systems reviewed and are negative.   Updated Vital Signs BP 112/79 (BP Location: Left Arm)   Pulse 82   Temp 98.3 F (36.8 C) (Oral)   Resp 15   Wt 77.6 kg   LMP 02/26/2024 (Exact Date)   SpO2 98%   Breastfeeding No   BMI 28.02 kg/m   Physical Exam Vitals and nursing note reviewed.  Constitutional:      General: She is not in acute distress.    Appearance: She is well-developed.  HENT:     Head: Normocephalic and atraumatic.  Eyes:     Conjunctiva/sclera: Conjunctivae normal.  Cardiovascular:     Rate and Rhythm: Normal rate and regular rhythm.     Pulses: No decreased pulses.          Dorsalis pedis pulses are 2+ on the right side and 2+ on the left side.       Posterior tibial pulses are 2+ on the right side and 2+ on the left side.     Heart sounds: No murmur heard. Pulmonary:     Effort: Pulmonary effort is normal. No respiratory distress.  Breath sounds: Normal breath sounds.  Abdominal:     Palpations: Abdomen is soft.     Tenderness: There is no abdominal tenderness.  Musculoskeletal:        General: No swelling.     Cervical back: Neck supple.     Right knee: Swelling and ecchymosis present. No deformity or crepitus. Tenderness present over the medial joint line, lateral joint line and patellar tendon.     Left knee: Swelling present. No deformity or crepitus. Tenderness present over the medial joint line, lateral joint line and patellar tendon.     Comments: Limited range of motion secondary to pain, notable crepitus appreciated in the joint with limited range of motion.  Skin:    General: Skin is warm and dry.      Capillary Refill: Capillary refill takes less than 2 seconds.  Neurological:     Mental Status: She is alert.  Psychiatric:        Mood and Affect: Mood normal.     (all labs ordered are listed, but only abnormal results are displayed) Labs Reviewed - No data to display  EKG: None  Radiology: DG Knee Complete 4 Views Left Result Date: 03/05/2024 CLINICAL DATA:  Bilateral knee pain after fall 2 days ago. EXAM: LEFT KNEE - COMPLETE 4+ VIEW COMPARISON:  None Available. FINDINGS: No evidence of fracture, dislocation, or joint effusion. No evidence of arthropathy or other focal bone abnormality. Soft tissues are unremarkable. IMPRESSION: Negative. Electronically Signed   By: Lynwood Landy Raddle M.D.   On: 03/05/2024 18:34   DG Knee Complete 4 Views Right Result Date: 03/05/2024 CLINICAL DATA:  Right knee pain after fall 2 days ago. EXAM: RIGHT KNEE - COMPLETE 4+ VIEW COMPARISON:  None Available. FINDINGS: No evidence of fracture, dislocation, or joint effusion. No evidence of arthropathy or other focal bone abnormality. Soft tissues are unremarkable. IMPRESSION: Negative. Electronically Signed   By: Lynwood Landy Raddle M.D.   On: 03/05/2024 18:33     Procedures   Medications Ordered in the ED - No data to display                                  Medical Decision Making  Medical Decision Making:   Colleen Patterson is a 38 y.o. female who presented to the ED today with bilateral knee pain detailed above.     Complete initial physical exam performed, notably the patient  was alert and oriented in no apparent distress.  Bilateral knees are tender and there is appreciable edema on the anterior surface of bilateral knees..    Reviewed and confirmed nursing documentation for past medical history, family history, social history.    Initial Assessment:   With the patient's presentation of the pain, most likely diagnosis is contusion of bilateral knees. Other diagnoses were considered including (but not  limited to) fracture of the bony structures associated with bilateral knees.. These are considered less likely due to history of present illness and physical exam findings.     Initial Plan:  Obtain x-ray imaging of bilateral knees. Objective evaluation as below reviewed   Initial Study Results:    Radiology:  All images reviewed independently. Agree with radiology report at this time.   DG Knee Complete 4 Views Left Result Date: 03/05/2024 CLINICAL DATA:  Bilateral knee pain after fall 2 days ago. EXAM: LEFT KNEE - COMPLETE 4+ VIEW COMPARISON:  None Available. FINDINGS:  No evidence of fracture, dislocation, or joint effusion. No evidence of arthropathy or other focal bone abnormality. Soft tissues are unremarkable. IMPRESSION: Negative. Electronically Signed   By: Lynwood Landy Raddle M.D.   On: 03/05/2024 18:34   DG Knee Complete 4 Views Right Result Date: 03/05/2024 CLINICAL DATA:  Right knee pain after fall 2 days ago. EXAM: RIGHT KNEE - COMPLETE 4+ VIEW COMPARISON:  None Available. FINDINGS: No evidence of fracture, dislocation, or joint effusion. No evidence of arthropathy or other focal bone abnormality. Soft tissues are unremarkable. IMPRESSION: Negative. Electronically Signed   By: Lynwood Landy Raddle M.D.   On: 03/05/2024 18:33    Reassessment and Plan:   After evaluating patient along with interpretation of the x-rays obtained, no fractures appreciated at this time.  As such believe this is consistent with contusion of bilateral knees, she has good circulation intact distally, at this time we will apply Ace bandages to both knees for compression, encouraged ice application, continue use of ibuprofen  for pain control, and referred to orthopedics for follow-up.       Final diagnoses:  Contusion of knee, unspecified laterality, initial encounter    ED Discharge Orders     None          Colleen Dorn BROCKS, PA 03/05/24 2313    Mannie Pac T, DO 03/06/24 [phone]

## 2024-03-14 ENCOUNTER — Ambulatory Visit: Admitting: Podiatry

## 2024-03-19 ENCOUNTER — Ambulatory Visit: Admitting: Podiatry

## 2024-04-11 ENCOUNTER — Ambulatory Visit: Admitting: Podiatry

## 2024-05-07 IMAGING — US US MFM OB FOLLOW-UP
1 series · 13 of 28 positions shown · non-contrast
Comparison: none

[Series 1: us mfm ob follow-up · 13 of 61 slices shown]
[im 3/61]
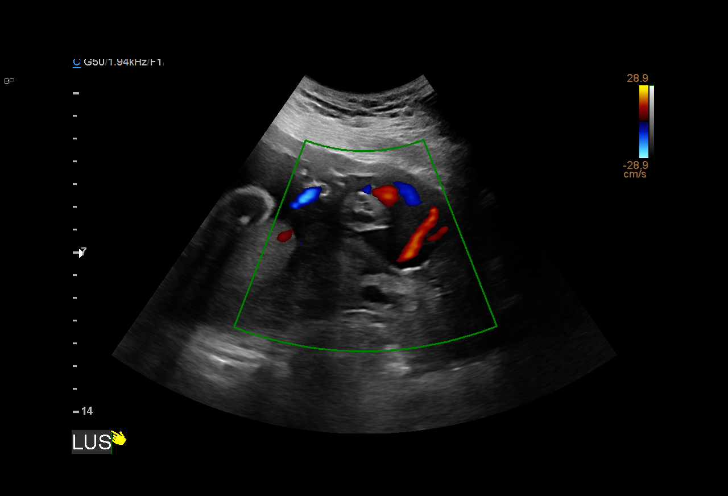
[im 7/61]
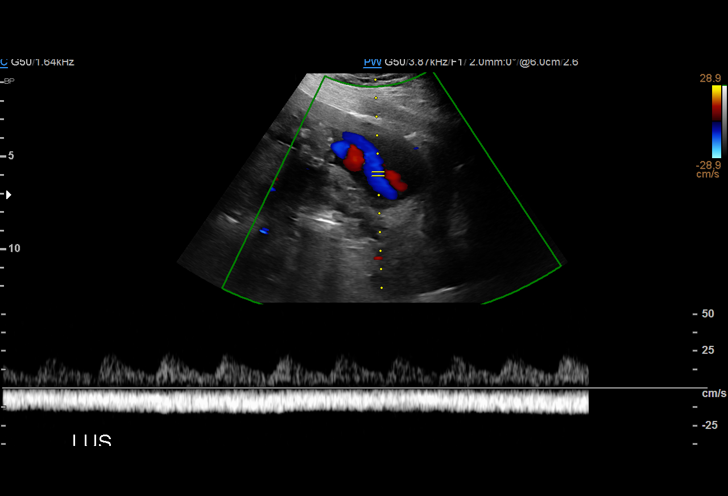
[im 12/61]
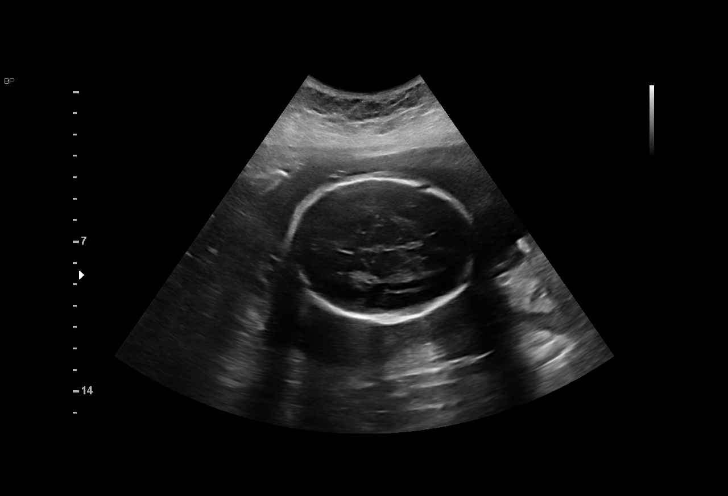
[im 16/61]
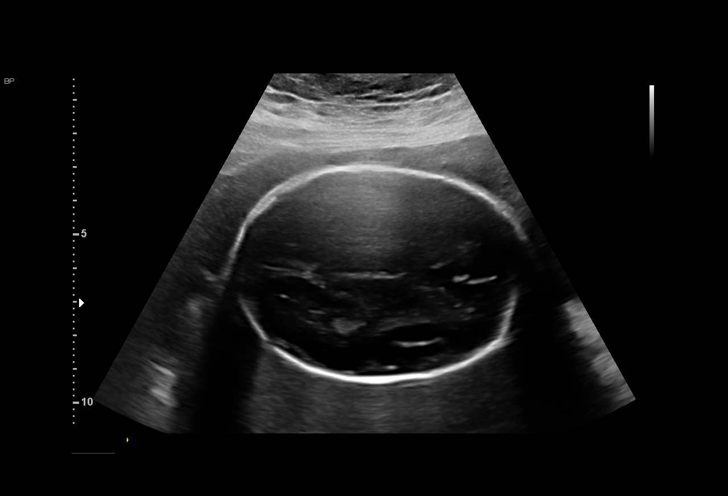
[im 21/61]
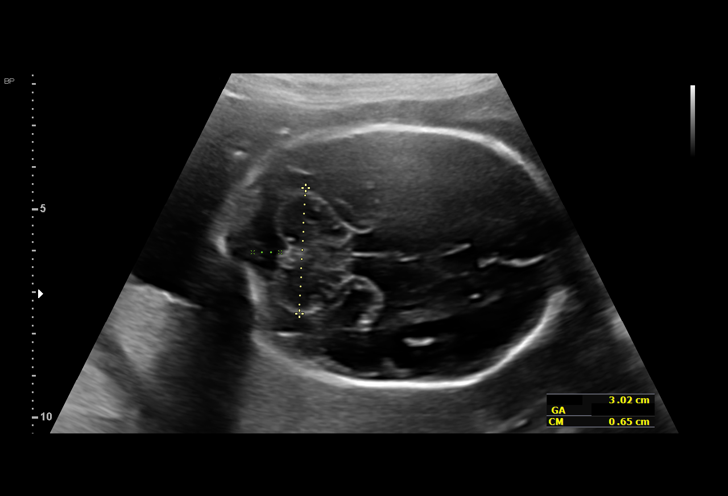
[im 25/61]
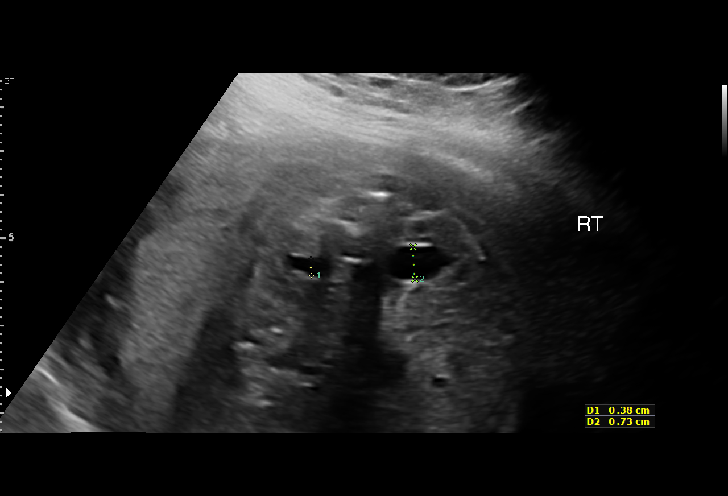
[im 32/61]
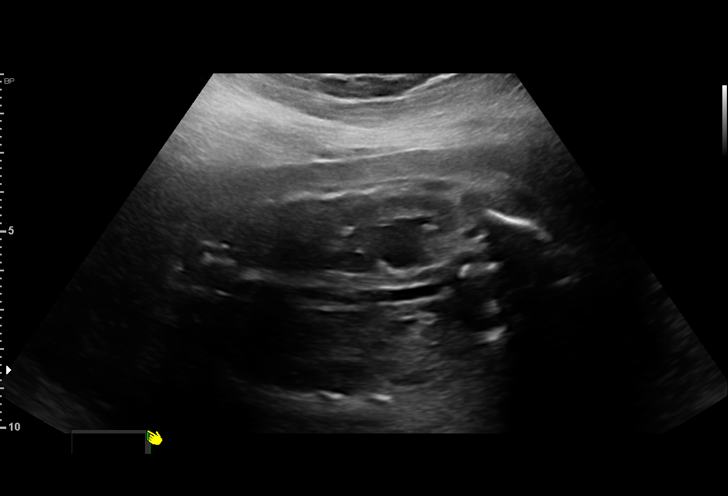
[im 36/61]
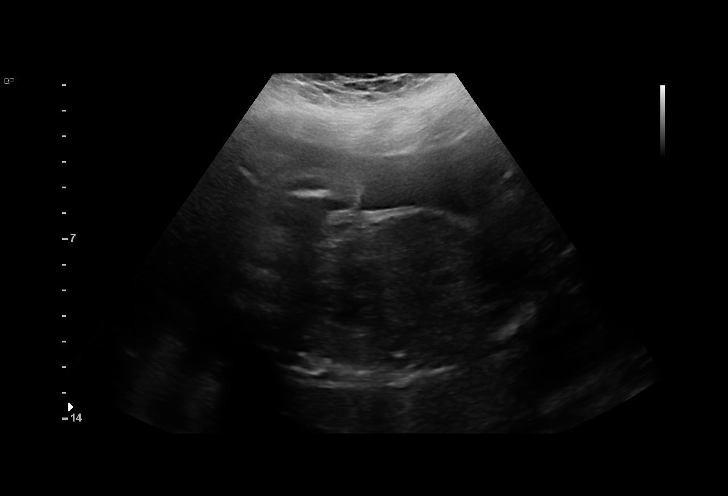
[im 41/61]
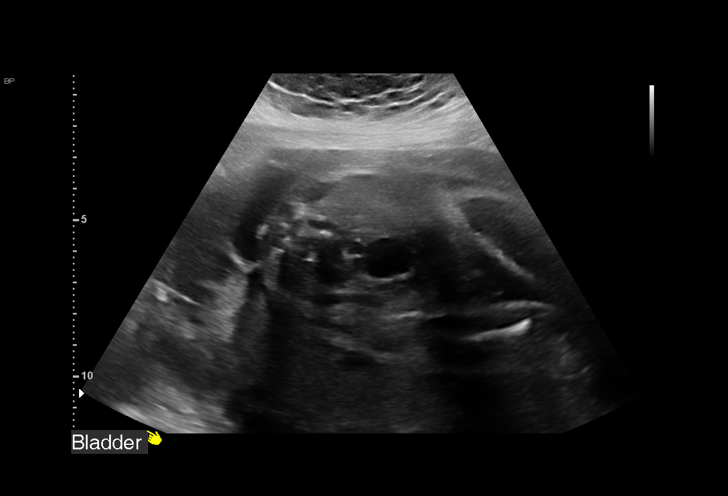
[im 45/61]
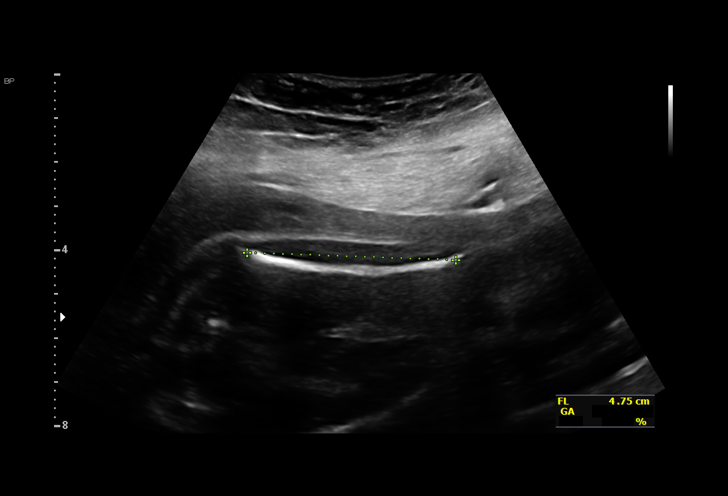
[im 49/61]
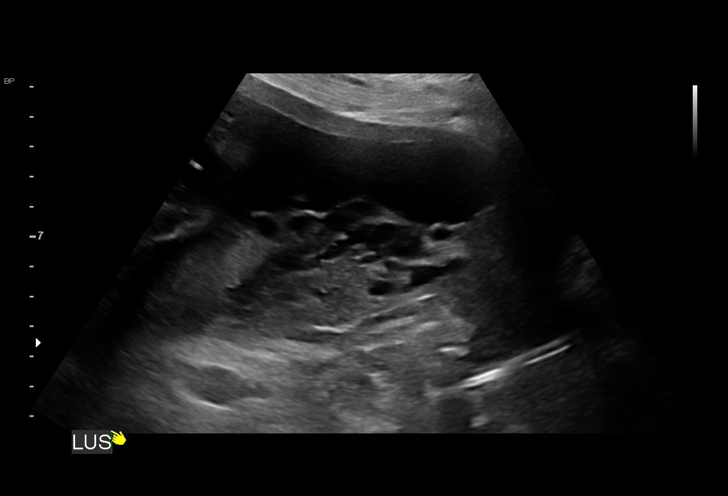
[im 54/61]
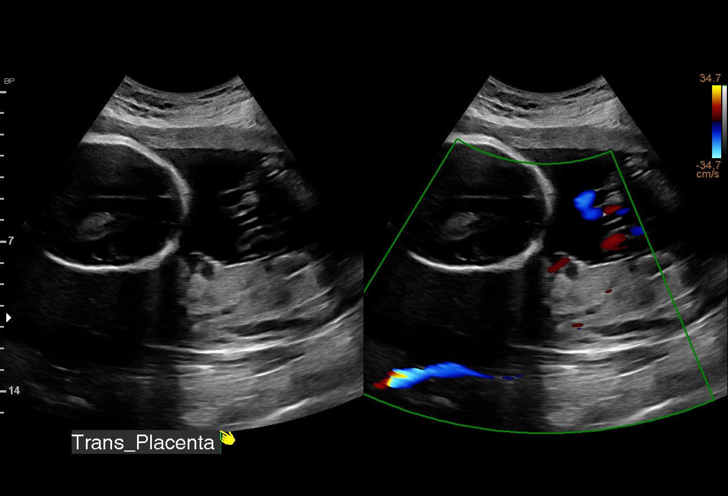
[im 58/61]
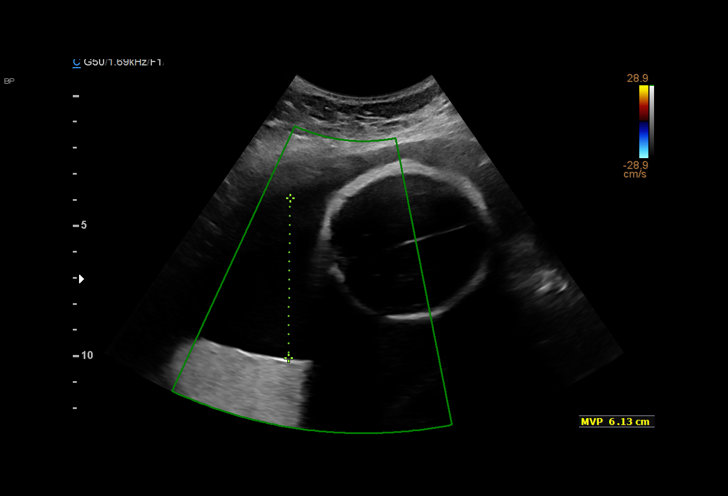

[13 of 28 positions shown; findings below may reference images not displayed]

SLAVIERO

Indications

 Vasa previa
 Obesity complicating pregnancy, second
 trimester (pre-G BMI 30)
 Previous cesarean delivery, antepartum (x2)
 LR NIPS - Male, Negative Horizon, Negative
 AFP
 Pyelectasis of fetus on prenatal ultrasound
 26 weeks gestation of pregnancy
Fetal Evaluation

 Num Of Fetuses:         1
 Fetal Heart Rate(bpm):  153
 Cardiac Activity:       Observed
 Presentation:           Breech
 Placenta:               Vasa Previa
 P. Cord Insertion:      Not well visualized

 Amniotic Fluid
 AFI FV:      Within normal limits

                             Largest Pocket(cm)

Biometry
 BPD:      63.9  mm     G. Age:  25w 6d         14  %    CI:        70.17   %    70 - 86
                                                         FL/HC:      19.5   %    18.6 -
 HC:      243.3  mm     G. Age:  26w 3d         16  %    HC/AC:      1.05        1.05 -
 AC:      231.3  mm     G. Age:  27w 3d         66  %    FL/BPD:     74.3   %    71 - 87
 FL:       47.5  mm     G. Age:  25w 6d         14  %    FL/AC:      20.5   %    20 - 24
 HUM:      45.1  mm     G. Age:  26w 5d         46  %
 CER:      30.2  mm     G. Age:  26w 2d         53  %

 LV:        3.5  mm
 CM:        6.5  mm

 Est. FW:     974  gm      2 lb 2 oz     38  %
OB History

 Gravidity:    5         Term:   2        Prem:   0        SAB:   2
 TOP:          0       Ectopic:  0        Living: 2
Gestational Age

 LMP:           26w 5d        Date:  07/02/21                 EDD:   04/08/22
 U/S Today:     26w 3d                                        EDD:   04/10/22
 Best:          26w 5d     Det. By:  LMP  (07/02/21)          EDD:   04/08/22
Anatomy

 Cranium:               Appears normal         LVOT:                   Previously seen
 Cavum:                 Appears normal         Aortic Arch:            Previously seen
 Ventricles:            Appears normal         Ductal Arch:            Not well visualized
 Choroid Plexus:        Previously seen        Diaphragm:              Appears normal
 Cerebellum:            Appears normal         Stomach:                Appears normal, left
                                                                       sided
 Posterior Fossa:       Appears normal         Abdomen:                Appears normal
 Nuchal Fold:           Previously seen        Abdominal Wall:         Previously seen
 Face:                  Orbits and profile     Cord Vessels:           Appears normal (3
                        previously seen                                vessel cord)
 Lips:                  Previously seen        Kidneys:                Right UTD 7.3 mm
 Palate:                Previously seen        Bladder:                Appears normal
 Thoracic:              Appears normal         Spine:                  Previously seen
 Heart:                 Previously seen        Upper Extremities:      Previously seen
 RVOT:                  Previously seen        Lower Extremities:      Previously seen

 Other:  Fetus appears to be a male Heels/feet and open hands/5th digits,
         nasal bone, lenses, 3VV, 3VT previously visualized. Technically
         difficult due to maternal habitus and fetal position.
Cervix Uterus Adnexa

 Cervix
 Length:           3.88  cm.
 Normal appearance by transabdominal scan.

 Uterus
 Normal shape and size.

 Right Ovary
 Within normal limits.
 Left Ovary
 Within normal limits.
Impression

 Follow up growth due to renal pyelectasis and known vasa
 previa.
 Normal interval growth with measurements consistent with
 dates
 Good fetal movement and amniotic fluid volume

 Unilateral UTD is seen in the right kidney of 7.3 mm and the
 left kidney is normal today.

 We observed the vasa previa again transabdominally. She is
 asymptomatic today and without contractions.

 She will return in 4 weeks we will consider a transvaginal
 exam at that time. We will discuss remaining outpatient vs
 inpatient at that time.

 Goal for delivery at 35 weeks.
